# Patient Record
Sex: Female | Born: 1975 | Race: Black or African American | Hispanic: No | Marital: Married | Smoking: Former smoker
Health system: Southern US, Community
[De-identification: ages and names within clinical notes are randomized; demographics above are authoritative.]

## PROBLEM LIST (undated history)

## (undated) DIAGNOSIS — IMO0002 Reserved for concepts with insufficient information to code with codable children: Secondary | ICD-10-CM

## (undated) DIAGNOSIS — O009 Unspecified ectopic pregnancy without intrauterine pregnancy: Secondary | ICD-10-CM

## (undated) HISTORY — DX: Unspecified ectopic pregnancy without intrauterine pregnancy: O00.90

## (undated) HISTORY — PX: HAND SURGERY: SHX662

## (undated) HISTORY — DX: Reserved for concepts with insufficient information to code with codable children: IMO0002

## (undated) HISTORY — PX: OTHER SURGICAL HISTORY: SHX169

---

## 2004-03-17 ENCOUNTER — Other Ambulatory Visit: Admission: RE | Admit: 2004-03-17 | Discharge: 2004-03-17 | Payer: Self-pay | Admitting: Obstetrics and Gynecology

## 2004-10-29 ENCOUNTER — Ambulatory Visit (HOSPITAL_COMMUNITY): Admission: RE | Admit: 2004-10-29 | Discharge: 2004-10-29 | Payer: Self-pay | Admitting: Obstetrics and Gynecology

## 2005-03-29 ENCOUNTER — Other Ambulatory Visit: Admission: RE | Admit: 2005-03-29 | Discharge: 2005-03-29 | Payer: Self-pay | Admitting: Obstetrics and Gynecology

## 2006-04-05 ENCOUNTER — Other Ambulatory Visit: Admission: RE | Admit: 2006-04-05 | Discharge: 2006-04-05 | Payer: Self-pay | Admitting: Obstetrics and Gynecology

## 2007-04-16 ENCOUNTER — Other Ambulatory Visit: Admission: RE | Admit: 2007-04-16 | Discharge: 2007-04-16 | Payer: Self-pay | Admitting: Obstetrics and Gynecology

## 2008-03-11 ENCOUNTER — Ambulatory Visit (HOSPITAL_COMMUNITY): Admission: RE | Admit: 2008-03-11 | Discharge: 2008-03-11 | Payer: Self-pay | Admitting: Ophthalmology

## 2008-03-18 ENCOUNTER — Encounter (INDEPENDENT_AMBULATORY_CARE_PROVIDER_SITE_OTHER): Payer: Self-pay | Admitting: Ophthalmology

## 2008-03-18 ENCOUNTER — Ambulatory Visit (HOSPITAL_COMMUNITY): Admission: RE | Admit: 2008-03-18 | Discharge: 2008-03-18 | Payer: Self-pay | Admitting: Ophthalmology

## 2008-04-22 ENCOUNTER — Other Ambulatory Visit: Admission: RE | Admit: 2008-04-22 | Discharge: 2008-04-22 | Payer: Self-pay | Admitting: Obstetrics and Gynecology

## 2008-10-07 ENCOUNTER — Ambulatory Visit: Payer: Self-pay | Admitting: Obstetrics and Gynecology

## 2008-10-07 ENCOUNTER — Encounter: Payer: Self-pay | Admitting: Obstetrics and Gynecology

## 2010-02-23 ENCOUNTER — Ambulatory Visit: Payer: Self-pay | Admitting: Obstetrics and Gynecology

## 2010-02-23 ENCOUNTER — Other Ambulatory Visit: Admission: RE | Admit: 2010-02-23 | Discharge: 2010-02-23 | Payer: Self-pay | Admitting: Obstetrics and Gynecology

## 2010-08-11 ENCOUNTER — Ambulatory Visit: Payer: Self-pay | Admitting: Gynecology

## 2010-09-01 ENCOUNTER — Ambulatory Visit: Payer: Self-pay | Admitting: Obstetrics and Gynecology

## 2011-03-09 ENCOUNTER — Encounter: Payer: Self-pay | Admitting: Obstetrics and Gynecology

## 2011-03-21 ENCOUNTER — Other Ambulatory Visit: Payer: Self-pay | Admitting: Obstetrics and Gynecology

## 2011-03-21 ENCOUNTER — Encounter (INDEPENDENT_AMBULATORY_CARE_PROVIDER_SITE_OTHER): Payer: BC Managed Care – PPO | Admitting: Obstetrics and Gynecology

## 2011-03-21 ENCOUNTER — Other Ambulatory Visit (HOSPITAL_COMMUNITY)
Admission: RE | Admit: 2011-03-21 | Discharge: 2011-03-21 | Disposition: A | Discharge status: Home / Self Care | Payer: BC Managed Care – PPO | Source: Ambulatory Visit | Attending: Obstetrics and Gynecology | Admitting: Obstetrics and Gynecology

## 2011-03-21 DIAGNOSIS — Z124 Encounter for screening for malignant neoplasm of cervix: Secondary | ICD-10-CM | POA: Insufficient documentation

## 2011-03-21 DIAGNOSIS — R823 Hemoglobinuria: Secondary | ICD-10-CM

## 2011-03-21 DIAGNOSIS — Z01419 Encounter for gynecological examination (general) (routine) without abnormal findings: Secondary | ICD-10-CM

## 2011-03-21 DIAGNOSIS — N938 Other specified abnormal uterine and vaginal bleeding: Secondary | ICD-10-CM

## 2011-03-21 DIAGNOSIS — N949 Unspecified condition associated with female genital organs and menstrual cycle: Secondary | ICD-10-CM

## 2011-09-19 LAB — GRAM STAIN

## 2011-10-19 ENCOUNTER — Ambulatory Visit: Payer: BC Managed Care – PPO | Admitting: Obstetrics and Gynecology

## 2011-10-19 ENCOUNTER — Telehealth: Payer: Self-pay

## 2011-10-19 NOTE — Telephone Encounter (Signed)
Patient called and c/o hematuria x a couple of days.  She has used a tampon and is sure it is not vaginal bleeding.  She also, c/s some frequency and some tingling at the end of urination.  She said when I called her back I will have to leave her a message.  I called her back at 10:55am and recommend office visit. I told her I have scheduled her for 12:30pm appt to check in 12:15pm.  Pt to call back if this will not work out for her.

## 2011-10-20 ENCOUNTER — Encounter: Payer: Self-pay | Admitting: Obstetrics and Gynecology

## 2011-10-20 ENCOUNTER — Ambulatory Visit (INDEPENDENT_AMBULATORY_CARE_PROVIDER_SITE_OTHER): Payer: BC Managed Care – PPO | Admitting: Obstetrics and Gynecology

## 2011-10-20 DIAGNOSIS — R823 Hemoglobinuria: Secondary | ICD-10-CM

## 2011-10-20 DIAGNOSIS — R319 Hematuria, unspecified: Secondary | ICD-10-CM

## 2011-10-20 MED ORDER — NITROFURANTOIN MONOHYD MACRO 100 MG PO CAPS: 100.0000 mg | ORAL_CAPSULE | Freq: Two times a day (BID) | ORAL | Status: AC

## 2011-10-20 NOTE — Progress Notes (Signed)
Patient came to see me today with a 48-hour history of hematuria. She is having no dysuria and no frequency. She's never had this problem previously.  Urinalysis: Too numerous to count red blood cells. Some white blood cells.  Assessment: Hematuria  Plan: Macrobid twice a day with food for 7 days. Urine culture. Office visit with urinalysis in one week. Discussed urological consult pending outcome of the above. Patient also asked again about her infertility. We have never gotten her husband's sperm count which was low. He's been treated with testosterone but stopped testosterone in June and did not go back until recently for sperm count. It was still low. Patient will get me the counts. We will then discussed whether we should proceed with super ovulation, IUI or IVF.

## 2011-10-27 ENCOUNTER — Ambulatory Visit (INDEPENDENT_AMBULATORY_CARE_PROVIDER_SITE_OTHER): Payer: BC Managed Care – PPO | Admitting: Obstetrics and Gynecology

## 2011-10-27 DIAGNOSIS — R319 Hematuria, unspecified: Secondary | ICD-10-CM

## 2011-10-27 DIAGNOSIS — N390 Urinary tract infection, site not specified: Secondary | ICD-10-CM

## 2011-10-27 NOTE — Progress Notes (Signed)
Patient came back to see me today for further followup. Urine culture showed 25,000 pure colonies. Patient took all her antibiotic and her hematuria is gone. She brought her husband's semen analysis today. 5 months after treatment he has 3 million sperm per ejaculate. The motility was decreased. Patient had a previous workup. She had a normal HSG, saline infusion histogram, and ovulatory progesterone. Her periods remain normal.  Assessment #1. Hematuria #2. 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 Patient was reassured. We'll recheck her urine in March at  yearly exam. Told her I thought her best chances of conception were with IVF. She was referred to the [facility] program.

## 2011-11-03 ENCOUNTER — Encounter: Payer: Self-pay | Admitting: Obstetrics and Gynecology

## 2012-03-21 ENCOUNTER — Other Ambulatory Visit (HOSPITAL_COMMUNITY)
Admission: RE | Admit: 2012-03-21 | Discharge: 2012-03-21 | Disposition: A | Discharge status: Home / Self Care | Payer: BC Managed Care – PPO | Source: Ambulatory Visit | Attending: Obstetrics and Gynecology | Admitting: Obstetrics and Gynecology

## 2012-03-21 ENCOUNTER — Ambulatory Visit (INDEPENDENT_AMBULATORY_CARE_PROVIDER_SITE_OTHER): Payer: BC Managed Care – PPO | Admitting: Obstetrics and Gynecology

## 2012-03-21 ENCOUNTER — Encounter: Payer: Self-pay | Admitting: Obstetrics and Gynecology

## 2012-03-21 ENCOUNTER — Other Ambulatory Visit: Payer: Self-pay | Admitting: Obstetrics and Gynecology

## 2012-03-21 VITALS — BP 134/84 | Ht 65.0 in | Wt 198.0 lb

## 2012-03-21 DIAGNOSIS — Z833 Family history of diabetes mellitus: Secondary | ICD-10-CM

## 2012-03-21 DIAGNOSIS — Z01419 Encounter for gynecological examination (general) (routine) without abnormal findings: Secondary | ICD-10-CM

## 2012-03-21 LAB — URINALYSIS W MICROSCOPIC + REFLEX CULTURE
Crystals: NONE SEEN
Glucose, UA: NEGATIVE mg/dL
Ketones, ur: NEGATIVE mg/dL
Nitrite: NEGATIVE
Protein, ur: NEGATIVE mg/dL
Specific Gravity, Urine: 1.025 (ref 1.005–1.030)
Urobilinogen, UA: 0.2 mg/dL (ref 0.0–1.0)

## 2012-03-21 NOTE — Progress Notes (Signed)
Patient came to see me today for her annual GYN exam. She is having regular cycles. She is having no pelvic pain. She is having no abnormal bleeding. She has had frequent endocervical polyps. When she has them she usually has pain with intercourse and some spotting. She has had none of those symptoms. She has an appointment [facility] infertility in early April. 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 She will notice due to moisture she has itching under her breasts.  Physical examination: Kennon Portela present. HEENT within normal limits. Neck: Thyroid not large. No masses. Supraclavicular nodes: not enlarged. Breasts: Examined in both sitting and lying  position. No skin changes and no masses. Abdomen: Soft no guarding rebound or masses or hernia. Pelvic: External: Within normal limits. BUS: Within normal limits. Vaginal:within normal limits. Good estrogen effect. No evidence of cystocele rectocele or enterocele. Cervix: clean. Uterus: Normal size and shape. Adnexa: No masses. Rectovaginal exam: Confirmatory and negative. Extremities: Within normal limits.  Assessment: Normal GYN exam. Low grade fungal infection under breast. Infertility.  Plan: Monistat-Derm. We pulled infertility records and will make copies and she will pick them up prior to her visit at [facility].

## 2012-03-22 LAB — CBC WITH DIFFERENTIAL/PLATELET
Basophils Relative: 1 % (ref 0–1)
Eosinophils Absolute: 0.3 10*3/uL (ref 0.0–0.7)
Eosinophils Relative: 3 % (ref 0–5)
HCT: 35 % — ABNORMAL LOW (ref 36.0–46.0)
Hemoglobin: 11.6 g/dL — ABNORMAL LOW (ref 12.0–15.0)
Lymphocytes Relative: 36 % (ref 12–46)
Lymphs Abs: 3.5 10*3/uL (ref 0.7–4.0)
MCH: 27.6 pg (ref 26.0–34.0)
MCHC: 33.1 g/dL (ref 30.0–36.0)
Monocytes Absolute: 0.5 10*3/uL (ref 0.1–1.0)
Monocytes Relative: 5 % (ref 3–12)
Neutro Abs: 5.5 10*3/uL (ref 1.7–7.7)
Platelets: 326 10*3/uL (ref 150–400)
RBC: 4.21 MIL/uL (ref 3.87–5.11)
RDW: 12.8 % (ref 11.5–15.5)
WBC: 9.8 10*3/uL (ref 4.0–10.5)

## 2012-03-22 LAB — HEMOGLOBIN A1C
Hgb A1c MFr Bld: 5.8 % — ABNORMAL HIGH (ref <5.7)
Mean Plasma Glucose: 120 mg/dL — ABNORMAL HIGH (ref <117)

## 2012-03-23 LAB — URINE CULTURE: Colony Count: 25000

## 2012-07-06 ENCOUNTER — Ambulatory Visit: Payer: BC Managed Care – PPO | Admitting: Gynecology

## 2012-07-13 ENCOUNTER — Ambulatory Visit: Payer: BC Managed Care – PPO | Admitting: Gynecology

## 2012-07-20 ENCOUNTER — Ambulatory Visit (INDEPENDENT_AMBULATORY_CARE_PROVIDER_SITE_OTHER): Payer: BC Managed Care – PPO | Admitting: Gynecology

## 2012-07-20 ENCOUNTER — Encounter: Payer: Self-pay | Admitting: Gynecology

## 2012-07-20 DIAGNOSIS — N926 Irregular menstruation, unspecified: Secondary | ICD-10-CM

## 2012-07-20 DIAGNOSIS — N841 Polyp of cervix uteri: Secondary | ICD-10-CM

## 2012-07-20 NOTE — Patient Instructions (Signed)
Office will call you with biopsy results 

## 2012-07-20 NOTE — Progress Notes (Signed)
Patient presents complaining of midcycle spotting and some discomfort with intercourse this past cycle. She suspects that she may have an endocervical polyp is these are the symptoms she has and has had a history of frequent polyps.  Exam with [name] assistant Pelvic: External BUS vagina normal. Cervix with endocervical polyp extruding from the costs. Uterus normal size midline mobile nontender. Adnexa without masses or tenderness.  Procedure: Endocervical polyp was removed in 2 passes using a cervical biopsy instrument to allow entrance into the canal.  Silver nitrate applied to the base afterwards.  Specimen sent to pathology.  Assessment and plan: Endocervical polyp removed. Patient will follow up her biopsy results. Otherwise assuming benign polyp plan routine follow up next year when she is due for her annual exam.

## 2013-06-21 ENCOUNTER — Ambulatory Visit (INDEPENDENT_AMBULATORY_CARE_PROVIDER_SITE_OTHER): Payer: BC Managed Care – PPO | Admitting: Gynecology

## 2013-06-21 ENCOUNTER — Encounter: Payer: Self-pay | Admitting: Gynecology

## 2013-06-21 DIAGNOSIS — N841 Polyp of cervix uteri: Secondary | ICD-10-CM

## 2013-06-21 NOTE — Progress Notes (Signed)
Patient presents complaining of "endocervical polyp". Has a history of recurrent polyps that always present with spotting after intercourse and pain with intercourse. This is been going on now for 2 weeks.  Exam was Radiographer, therapeutic External BUS vagina normal. Cervix with small endocervical polyp. Uterus normal size midline mobile nontender. Adnexa without masses or tenderness.  Procedure: Under colposcopic guidance an endocervical curettage was performed to remove the endocervical polyp and stalk. This was sent to pathology. Silver nitrate was applied afterwards.  Assessment and plan: Recurrent endocervical polyp removed. Sent to pathology. Followup as needed otherwise for her annual exam.

## 2013-06-21 NOTE — Patient Instructions (Signed)
Follow up if any issues

## 2013-07-12 ENCOUNTER — Encounter: Payer: Self-pay | Admitting: Gynecology

## 2013-07-12 ENCOUNTER — Ambulatory Visit (INDEPENDENT_AMBULATORY_CARE_PROVIDER_SITE_OTHER): Payer: BC Managed Care – PPO | Admitting: Gynecology

## 2013-07-12 VITALS — BP 120/76 | Ht 64.0 in | Wt 205.0 lb

## 2013-07-12 DIAGNOSIS — Z01419 Encounter for gynecological examination (general) (routine) without abnormal findings: Secondary | ICD-10-CM

## 2013-07-12 DIAGNOSIS — Z13220 Encounter for screening for lipoid disorders: Secondary | ICD-10-CM

## 2013-07-12 LAB — CBC WITH DIFFERENTIAL/PLATELET
Basophils Absolute: 0.1 10*3/uL (ref 0.0–0.1)
Basophils Relative: 1 % (ref 0–1)
Eosinophils Absolute: 0.6 10*3/uL (ref 0.0–0.7)
Eosinophils Relative: 6 % — ABNORMAL HIGH (ref 0–5)
HCT: 36.4 % (ref 36.0–46.0)
Hemoglobin: 12.5 g/dL (ref 12.0–15.0)
Lymphocytes Relative: 25 % (ref 12–46)
MCH: 27.4 pg (ref 26.0–34.0)
MCHC: 34.3 g/dL (ref 30.0–36.0)
MCV: 79.6 fL (ref 78.0–100.0)
Monocytes Absolute: 0.4 10*3/uL (ref 0.1–1.0)
Monocytes Relative: 4 % (ref 3–12)
Neutro Abs: 6.2 10*3/uL (ref 1.7–7.7)
Neutrophils Relative %: 64 % (ref 43–77)
RBC: 4.57 MIL/uL (ref 3.87–5.11)
RDW: 13.5 % (ref 11.5–15.5)

## 2013-07-12 NOTE — Progress Notes (Signed)
Megan Turner May 21, 1976 [id number]        37 y.o.  G1P0010 for annual exam.  Former patient of Dr. Eda Paschal. Recent removal of a benign endocervical polyp.  Past medical history,surgical history, medications, allergies, family history and social history were all reviewed and documented in the EPIC chart.  ROS:  Performed and pertinent positives and negatives are included in the history, assessment and plan .  Exam: [name] assistant Filed Vitals:   07/12/13 1158  BP: 120/76  Height: 5\' 4"  (1.626 m)  Weight: 205 lb (92.987 kg)   General appearance  Normal Skin grossly normal Head/Neck normal with no cervical or supraclavicular adenopathy thyroid normal Lungs  clear Cardiac RR, without RMG Abdominal  soft, nontender, without masses, organomegaly or hernia Breasts  examined lying and sitting without masses, retractions, discharge or axillary adenopathy. Pelvic  Ext/BUS/vagina  normal   Cervix  normal   Uterus  anteverted, normal size, shape and contour, midline and mobile nontender   Adnexa  Without masses or tenderness    Anus and perineum  normal   Rectovaginal  normal sphincter tone without palpated masses or tenderness.    Assessment/Plan:  37 y.o. G64P0010 female for annual exam, regular menses. .   1. Infertility. Patient is actively undergoing infertility evaluation and treatment by Dr. April Manson and will continue to followup with him. 2. Recent endocervical polyp removal. Exam is normal today. We'll continue to monitor. 3. Pap smear 02/2012. The Pap smear done today. No history of abnormal Pap smears previously. Discussed current screening guidelines we'll plan repeat a 3 year interval. 4. Screening mammographic recommendations between 35 and 40 were reviewed. Is no strong family history and plans to wait closer to 40 schedule. S/P not be reviewed. 5. Health maintenance. Baseline CBC comprehensive metabolic panel urinalysis lipid profile ordered. Followup one year, sooner  as needed.  Note: This document was prepared with digital dictation and possible smart phrase technology. Any transcriptional errors that result from this process are unintentional.   Dara Lords MD, 12:45 PM 07/12/2013

## 2013-07-12 NOTE — Patient Instructions (Signed)
Follow up in one year for annual exam 

## 2013-07-13 LAB — URINALYSIS W MICROSCOPIC + REFLEX CULTURE
Bacteria, UA: NONE SEEN
Bilirubin Urine: NEGATIVE
Casts: NONE SEEN
Hgb urine dipstick: NEGATIVE
Ketones, ur: NEGATIVE mg/dL
Leukocytes, UA: NEGATIVE
Nitrite: NEGATIVE
Protein, ur: NEGATIVE mg/dL
Specific Gravity, Urine: 1.017 (ref 1.005–1.030)
Urobilinogen, UA: 0.2 mg/dL (ref 0.0–1.0)
pH: 5.5 (ref 5.0–8.0)

## 2013-07-13 LAB — COMPREHENSIVE METABOLIC PANEL
ALT: 11 U/L (ref 0–35)
AST: 12 U/L (ref 0–37)
Albumin: 3.9 g/dL (ref 3.5–5.2)
Alkaline Phosphatase: 68 U/L (ref 39–117)
BUN: 9 mg/dL (ref 6–23)
CO2: 25 mEq/L (ref 19–32)
Chloride: 105 mEq/L (ref 96–112)
Glucose, Bld: 100 mg/dL — ABNORMAL HIGH (ref 70–99)
Potassium: 4.2 mEq/L (ref 3.5–5.3)
Sodium: 137 mEq/L (ref 135–145)
Total Bilirubin: 0.3 mg/dL (ref 0.3–1.2)
Total Protein: 6.7 g/dL (ref 6.0–8.3)

## 2013-07-13 LAB — LIPID PANEL
Cholesterol: 188 mg/dL (ref 0–200)
HDL: 60 mg/dL (ref >39)
LDL Cholesterol: 115 mg/dL — ABNORMAL HIGH (ref 0–99)
Total CHOL/HDL Ratio: 3.1 Ratio
Triglycerides: 67 mg/dL (ref <150)
VLDL: 13 mg/dL (ref 0–40)

## 2013-07-17 ENCOUNTER — Encounter: Payer: Self-pay | Admitting: Obstetrics and Gynecology

## 2014-04-25 DIAGNOSIS — O009 Unspecified ectopic pregnancy without intrauterine pregnancy: Secondary | ICD-10-CM

## 2014-04-25 HISTORY — DX: Unspecified ectopic pregnancy without intrauterine pregnancy: O00.90

## 2014-05-27 ENCOUNTER — Encounter: Payer: Self-pay | Admitting: Gynecology

## 2014-05-27 ENCOUNTER — Ambulatory Visit (INDEPENDENT_AMBULATORY_CARE_PROVIDER_SITE_OTHER): Payer: BC Managed Care – PPO | Admitting: Gynecology

## 2014-05-27 DIAGNOSIS — N899 Noninflammatory disorder of vagina, unspecified: Secondary | ICD-10-CM

## 2014-05-27 DIAGNOSIS — N898 Other specified noninflammatory disorders of vagina: Secondary | ICD-10-CM

## 2014-05-27 DIAGNOSIS — N841 Polyp of cervix uteri: Secondary | ICD-10-CM

## 2014-05-27 LAB — WET PREP FOR TRICH, YEAST, CLUE
Clue Cells Wet Prep HPF POC: NONE SEEN
Trich, Wet Prep: NONE SEEN
Yeast Wet Prep HPF POC: NONE SEEN

## 2014-05-27 MED ORDER — FLUCONAZOLE 150 MG PO TABS: 150.0000 mg | ORAL_TABLET | Freq: Once | ORAL | Status: DC

## 2014-05-27 NOTE — Patient Instructions (Signed)
Take the one Diflucan pill. Call me if your symptoms persist, worsen or recur. Office will call you with the biopsy results.

## 2014-05-27 NOTE — Progress Notes (Signed)
Megan Turner 18-Mar-1976 [id number]        38 y.o.  G1P0010 presents complaining of itching and vulvar irritation. A round of antibiotics previously and this started after that. Is currently undergoing treatment for an ectopic pregnancy following serial beta-hCGs after methotrexate treatment. Last value 20 with planned repeat next week. Currently undergoing infertility treatment.  Past medical history,surgical history, problem list, medications, allergies, family history and social history were all reviewed and documented in the EPIC chart.  Directed ROS with pertinent positives and negatives documented in the history of present illness/assessment and plan.  Exam: [name] assistant General appearance  Normal External BUS vagina with whitish discharge. Cervix with endocervical polyp extruding from the os. Uterus normal size midline mobile nontender. Adnexa without masses or tenderness.  Procedure: Endocervical polyp was biopsied off with the Kevorkian biopsy. Sent to pathology. Patient tolerated well.  Assessment/Plan:  38 y.o. G1P0010  with: 1. Vaginal irritation and discharge. Wet prep is unremarkable but she did treat herself previously with OTC. Will treat with Diflucan 150 mg x1 dose. Followup if symptoms persist, worsen or recur. 2. Endocervical polyp. Biopsied off. Patient will followup for pathology report. Patient will continue to followup with her infertility doctor for treatment of her ectopic and subsequent infertility care.   Note: This document was prepared with digital dictation and possible smart phrase technology. Any transcriptional errors that result from this process are unintentional.   Anastasio Auerbach MD, 4:30 PM 05/27/2014

## 2014-07-24 ENCOUNTER — Encounter: Payer: Self-pay | Admitting: Gynecology

## 2014-07-24 ENCOUNTER — Ambulatory Visit (INDEPENDENT_AMBULATORY_CARE_PROVIDER_SITE_OTHER): Payer: BC Managed Care – PPO | Admitting: Gynecology

## 2014-07-24 ENCOUNTER — Other Ambulatory Visit (HOSPITAL_COMMUNITY)
Admission: RE | Admit: 2014-07-24 | Discharge: 2014-07-24 | Disposition: A | Discharge status: Home / Self Care | Payer: BC Managed Care – PPO | Source: Ambulatory Visit | Attending: Gynecology | Admitting: Gynecology

## 2014-07-24 VITALS — BP 134/84 | Ht 64.0 in | Wt 240.0 lb

## 2014-07-24 DIAGNOSIS — Z1151 Encounter for screening for human papillomavirus (HPV): Secondary | ICD-10-CM | POA: Insufficient documentation

## 2014-07-24 DIAGNOSIS — Z01419 Encounter for gynecological examination (general) (routine) without abnormal findings: Secondary | ICD-10-CM | POA: Insufficient documentation

## 2014-07-24 LAB — COMPREHENSIVE METABOLIC PANEL WITH GFR
ALT: 13 U/L (ref 0–35)
AST: 16 U/L (ref 0–37)
Albumin: 3.9 g/dL (ref 3.5–5.2)
Alkaline Phosphatase: 61 U/L (ref 39–117)
BUN: 10 mg/dL (ref 6–23)
CO2: 24 meq/L (ref 19–32)
Calcium: 9.3 mg/dL (ref 8.4–10.5)
Chloride: 106 meq/L (ref 96–112)
Creat: 0.91 mg/dL (ref 0.50–1.10)
Glucose, Bld: 95 mg/dL (ref 70–99)
Potassium: 3.9 meq/L (ref 3.5–5.3)
Sodium: 138 meq/L (ref 135–145)
Total Bilirubin: 0.2 mg/dL (ref 0.2–1.2)
Total Protein: 6.9 g/dL (ref 6.0–8.3)

## 2014-07-24 LAB — LIPID PANEL
Cholesterol: 178 mg/dL (ref 0–200)
HDL: 46 mg/dL (ref >39)
LDL Cholesterol: 98 mg/dL (ref 0–99)
Total CHOL/HDL Ratio: 3.9 ratio
Triglycerides: 169 mg/dL — ABNORMAL HIGH (ref <150)
VLDL: 34 mg/dL (ref 0–40)

## 2014-07-24 NOTE — Progress Notes (Signed)
Megan Turner 09/15/1976 [id number]        38 y.o.  G1P0010 for annual exam.  Several issues noted below.  Past medical history,surgical history, problem list, medications, allergies, family history and social history were all reviewed and documented as reviewed in the EPIC chart.  ROS:  12 system ROS performed with pertinent positives and negatives included in the history, assessment and plan.   Additional significant findings :  None   Exam: [name] Counsellor Vitals:   07/24/14 1618  BP: 134/84  Height: 5\' 4"  (1.626 m)  Weight: 240 lb (108.863 kg)   General appearance:  Normal affect, orientation and appearance. Skin: Grossly normal HEENT: Without gross lesions.  No cervical or supraclavicular adenopathy. Thyroid normal.  Lungs:  Clear without wheezing, rales or rhonchi Cardiac: RR, without RMG Abdominal:  Soft, nontender, without masses, guarding, rebound, organomegaly or hernia Breasts:  Examined lying and sitting without masses, retractions, discharge or axillary adenopathy. Pelvic:  Ext/BUS/vagina normal  Cervix normal. Pap/HPV  Uterus anteverted, normal size, shape and contour, midline and mobile nontender   Adnexa  Without masses or tenderness    Anus and perineum  Normal   Rectovaginal  Normal sphincter tone without palpated masses or tenderness.    Assessment/Plan:  38 y.o. G13P0010 female for annual exam.   1. Infertility/irregular menses. Patient is undergoing in vitro fertilization through [facility]. Unfortunately recently had ectopic pregnancy in May requiring 2 courses of methotrexate. They are still following her beta hCGs. Patient will continue to followup with them in reference to her fertility treatments. 2. Pap smear 2013. Pap/HPV today. No history of abnormal Pap smears previously. Did have benign reactive changes on her last Pap smear. Assuming this Pap smear is normal then plan 3-5 year interval repeat per current screening guidelines. 3. Mammography. I  reviewed screening mammographic recommendations between 67 and 40. She has no strong family history. Patient's going to wait until closer to 40 and hopefully have achieved pregnancy before then. 4. Health maintenance. Blood pressure 134/84. Reviewed with patient. Recommended to follow in a non-exam situation. If remains elevated will need referral to a primary physician for management. Issues of weight and exercise discussed. Baseline CBC comprehensive metabolic panel lipid profile urinalysis ordered. Followup in one year, sooner as needed.`   Note: This document was prepared with digital dictation and possible smart phrase technology. Any transcriptional errors that result from this process are unintentional.   Anastasio Auerbach MD, 4:39 PM 07/24/2014

## 2014-07-24 NOTE — Patient Instructions (Signed)
Continue to followup with your infertility doctor.  Continue to follow your blood pressure. Report if it remains elevated.  You may obtain a copy of any labs that were done today by logging onto MyChart as outlined in the instructions provided with your AVS (after visit summary). The office will not call with normal lab results but certainly if there are any significant abnormalities then we will contact you.   Health Maintenance, Female A healthy lifestyle and preventative care can promote health and wellness.  Maintain regular health, dental, and eye exams.  Eat a healthy diet. Foods like vegetables, fruits, whole grains, low-fat dairy products, and lean protein foods contain the nutrients you need without too many calories. Decrease your intake of foods high in solid fats, added sugars, and salt. Get information about a proper diet from your caregiver, if necessary.  Regular physical exercise is one of the most important things you can do for your health. Most adults should get at least 150 minutes of moderate-intensity exercise (any activity that increases your heart rate and causes you to sweat) each week. In addition, most adults need muscle-strengthening exercises on 2 or more days a week.   Maintain a healthy weight. The body mass index (BMI) is a screening tool to identify possible weight problems. It provides an estimate of body fat based on height and weight. Your caregiver can help determine your BMI, and can help you achieve or maintain a healthy weight. For adults 20 years and older:  A BMI below 18.5 is considered underweight.  A BMI of 18.5 to 24.9 is normal.  A BMI of 25 to 29.9 is considered overweight.  A BMI of 30 and above is considered obese.  Maintain normal blood lipids and cholesterol by exercising and minimizing your intake of saturated fat. Eat a balanced diet with plenty of fruits and vegetables. Blood tests for lipids and cholesterol should begin at age 60 and  be repeated every 5 years. If your lipid or cholesterol levels are high, you are over 50, or you are a high risk for heart disease, you may need your cholesterol levels checked more frequently.Ongoing high lipid and cholesterol levels should be treated with medicines if diet and exercise are not effective.  If you smoke, find out from your caregiver how to quit. If you do not use tobacco, do not start.  Lung cancer screening is recommended for adults aged 64 80 years who are at high risk for developing lung cancer because of a history of smoking. Yearly low-dose computed tomography (CT) is recommended for people who have at least a 30-pack-year history of smoking and are a current smoker or have quit within the past 15 years. A pack year of smoking is smoking an average of 1 pack of cigarettes a day for 1 year (for example: 1 pack a day for 30 years or 2 packs a day for 15 years). Yearly screening should continue until the smoker has stopped smoking for at least 15 years. Yearly screening should also be stopped for people who develop a health problem that would prevent them from having lung cancer treatment.  If you are pregnant, do not drink alcohol. If you are breastfeeding, be very cautious about drinking alcohol. If you are not pregnant and choose to drink alcohol, do not exceed 1 drink per day. One drink is considered to be 12 ounces (355 mL) of beer, 5 ounces (148 mL) of wine, or 1.5 ounces (44 mL) of liquor.  Avoid use  of street drugs. Do not share needles with anyone. Ask for help if you need support or instructions about stopping the use of drugs.  High blood pressure causes heart disease and increases the risk of stroke. Blood pressure should be checked at least every 1 to 2 years. Ongoing high blood pressure should be treated with medicines, if weight loss and exercise are not effective.  If you are 17 to 36 years old, ask your caregiver if you should take aspirin to prevent  strokes.  Diabetes screening involves taking a blood sample to check your fasting blood sugar level. This should be done once every 3 years, after age 95, if you are within normal weight and without risk factors for diabetes. Testing should be considered at a younger age or be carried out more frequently if you are overweight and have at least 1 risk factor for diabetes.  Breast cancer screening is essential preventative care for women. You should practice "breast self-awareness." This means understanding the normal appearance and feel of your breasts and may include breast self-examination. Any changes detected, no matter how small, should be reported to a caregiver. Women in their 79s and 30s should have a clinical breast exam (CBE) by a caregiver as part of a regular health exam every 1 to 3 years. After age 70, women should have a CBE every year. Starting at age 20, women should consider having a mammogram (breast X-ray) every year. Women who have a family history of breast cancer should talk to their caregiver about genetic screening. Women at a high risk of breast cancer should talk to their caregiver about having an MRI and a mammogram every year.  Breast cancer gene (BRCA)-related cancer risk assessment is recommended for women who have family members with BRCA-related cancers. BRCA-related cancers include breast, ovarian, tubal, and peritoneal cancers. Having family members with these cancers may be associated with an increased risk for harmful changes (mutations) in the breast cancer genes BRCA1 and BRCA2. Results of the assessment will determine the need for genetic counseling and BRCA1 and BRCA2 testing.  The Pap test is a screening test for cervical cancer. Women should have a Pap test starting at age 87. Between ages 53 and 62, Pap tests should be repeated every 2 years. Beginning at age 46, you should have a Pap test every 3 years as long as the past 3 Pap tests have been normal. If you had a  hysterectomy for a problem that was not cancer or a condition that could lead to cancer, then you no longer need Pap tests. If you are between ages 61 and 6, and you have had normal Pap tests going back 10 years, you no longer need Pap tests. If you have had past treatment for cervical cancer or a condition that could lead to cancer, you need Pap tests and screening for cancer for at least 20 years after your treatment. If Pap tests have been discontinued, risk factors (such as a new sexual partner) need to be reassessed to determine if screening should be resumed. Some women have medical problems that increase the chance of getting cervical cancer. In these cases, your caregiver may recommend more frequent screening and Pap tests.  The human papillomavirus (HPV) test is an additional test that may be used for cervical cancer screening. The HPV test looks for the virus that can cause the cell changes on the cervix. The cells collected during the Pap test can be tested for HPV. The HPV test  could be used to screen women aged 17 years and older, and should be used in women of any age who have unclear Pap test results. After the age of 66, women should have HPV testing at the same frequency as a Pap test.  Colorectal cancer can be detected and often prevented. Most routine colorectal cancer screening begins at the age of 74 and continues through age 6. However, your caregiver may recommend screening at an earlier age if you have risk factors for colon cancer. On a yearly basis, your caregiver may provide home test kits to check for hidden blood in the stool. Use of a small camera at the end of a tube, to directly examine the colon (sigmoidoscopy or colonoscopy), can detect the earliest forms of colorectal cancer. Talk to your caregiver about this at age 43, when routine screening begins. Direct examination of the colon should be repeated every 5 to 10 years through age 65, unless early forms of pre-cancerous  polyps or small growths are found.  Hepatitis C blood testing is recommended for all people born from [birth year] through 1963 and any individual with known risks for hepatitis C.  Practice safe sex. Use condoms and avoid high-risk sexual practices to reduce the spread of sexually transmitted infections (STIs). Sexually active women aged 48 and younger should be checked for Chlamydia, which is a common sexually transmitted infection. Older women with new or multiple partners should also be tested for Chlamydia. Testing for other STIs is recommended if you are sexually active and at increased risk.  Osteoporosis is a disease in which the bones lose minerals and strength with aging. This can result in serious bone fractures. The risk of osteoporosis can be identified using a bone density scan. Women ages 85 and over and women at risk for fractures or osteoporosis should discuss screening with their caregivers. Ask your caregiver whether you should be taking a calcium supplement or vitamin D to reduce the rate of osteoporosis.  Menopause can be associated with physical symptoms and risks. Hormone replacement therapy is available to decrease symptoms and risks. You should talk to your caregiver about whether hormone replacement therapy is right for you.  Use sunscreen. Apply sunscreen liberally and repeatedly throughout the day. You should seek shade when your shadow is shorter than you. Protect yourself by wearing long sleeves, pants, a wide-brimmed hat, and sunglasses year round, whenever you are outdoors.  Notify your caregiver of new moles or changes in moles, especially if there is a change in shape or color. Also notify your caregiver if a mole is larger than the size of a pencil eraser.  Stay current with your immunizations. Document Released: 06/27/2011 Document Revised: 04/08/2013 Document Reviewed: 06/27/2011 [facility] Patient Information 2014 [place].

## 2014-07-25 LAB — CBC WITH DIFFERENTIAL/PLATELET
Basophils Absolute: 0.1 10*3/uL (ref 0.0–0.1)
Basophils Relative: 1 % (ref 0–1)
Eosinophils Absolute: 0.2 10*3/uL (ref 0.0–0.7)
Eosinophils Relative: 2 % (ref 0–5)
HCT: 36 % (ref 36.0–46.0)
Hemoglobin: 12 g/dL (ref 12.0–15.0)
Lymphocytes Relative: 32 % (ref 12–46)
Lymphs Abs: 3.4 10*3/uL (ref 0.7–4.0)
MCH: 27 pg (ref 26.0–34.0)
MCHC: 33.3 g/dL (ref 30.0–36.0)
MCV: 80.9 fL (ref 78.0–100.0)
MONOS PCT: 5 % (ref 3–12)
Monocytes Absolute: 0.5 10*3/uL (ref 0.1–1.0)
Neutro Abs: 6.3 10*3/uL (ref 1.7–7.7)
Neutrophils Relative %: 60 % (ref 43–77)
Platelets: 329 10*3/uL (ref 150–400)
RBC: 4.45 MIL/uL (ref 3.87–5.11)
RDW: 13.9 % (ref 11.5–15.5)
WBC: 10.5 10*3/uL (ref 4.0–10.5)

## 2014-07-25 LAB — URINALYSIS W MICROSCOPIC + REFLEX CULTURE
Bacteria, UA: NONE SEEN
Bilirubin Urine: NEGATIVE
CASTS: NONE SEEN
CRYSTALS: NONE SEEN
GLUCOSE, UA: NEGATIVE mg/dL
Ketones, ur: NEGATIVE mg/dL
Leukocytes, UA: NEGATIVE
Nitrite: NEGATIVE
Protein, ur: NEGATIVE mg/dL
Specific Gravity, Urine: 1.016 (ref 1.005–1.030)
Squamous Epithelial / HPF: NONE SEEN
Urobilinogen, UA: 0.2 mg/dL (ref 0.0–1.0)
pH: 6 (ref 5.0–8.0)

## 2014-07-28 LAB — CYTOLOGY - PAP

## 2014-10-27 ENCOUNTER — Encounter: Payer: Self-pay | Admitting: Gynecology

## 2014-12-23 DIAGNOSIS — N838 Other noninflammatory disorders of ovary, fallopian tube and broad ligament: Secondary | ICD-10-CM | POA: Insufficient documentation

## 2014-12-26 NOTE — L&D Delivery Note (Signed)
Vaginal Delivery Note for Baby A At 1645 the pt c/o increase rectal pressure.  VE, Baby "A" is in the vaginal vault.    Self directed, self guided  pushing began at 1658.   After 30 seconds of pushing the body of a non viable what appears to be a female  infant "Megan Turner" delivered spontaneously with maternal effort in the breech position at 1659   With no signs of life the umbilical cord was clamped and cut then the infant was handed to the mom.  The FOB was a the bedside holding the patients hand but visually distraught and unable to look at the infant.  With mere lifting and no traction the cord separated from the placenta.  Placenta is undelivered as of the timing of this note.   Episiotomy: None  The infant was taken to the warmer to me measured, weight and dress, then he was brought back to mom.  Continue labor orders as written  Current EBL 0,    Sponge, laps and needle count correct and verified with the primary care nurse.  Attending MD available at all times.    Mom was left in stable condition, holding baby.   Pt is requesting genetic testing.      Megan Turner, CNM, MSN 04/20/2015. 5:35 PM

## 2015-02-12 DIAGNOSIS — O200 Threatened abortion: Secondary | ICD-10-CM | POA: Insufficient documentation

## 2015-03-05 LAB — OB RESULTS CONSOLE ANTIBODY SCREEN: Antibody Screen: NEGATIVE

## 2015-03-05 LAB — OB RESULTS CONSOLE HIV ANTIBODY (ROUTINE TESTING): HIV: NONREACTIVE

## 2015-03-05 LAB — OB RESULTS CONSOLE ABO/RH: RH TYPE: POSITIVE

## 2015-03-05 LAB — OB RESULTS CONSOLE GBS: GBS: NEGATIVE

## 2015-03-05 LAB — OB RESULTS CONSOLE RPR: RPR: NONREACTIVE

## 2015-03-05 LAB — OB RESULTS CONSOLE HEPATITIS B SURFACE ANTIGEN: Hepatitis B Surface Ag: NEGATIVE

## 2015-03-05 LAB — OB RESULTS CONSOLE GC/CHLAMYDIA
Chlamydia: NEGATIVE
Gonorrhea: NEGATIVE

## 2015-03-05 LAB — OB RESULTS CONSOLE RUBELLA ANTIBODY, IGM: Rubella: IMMUNE

## 2015-03-17 ENCOUNTER — Other Ambulatory Visit (HOSPITAL_COMMUNITY): Payer: Self-pay

## 2015-04-20 ENCOUNTER — Encounter (HOSPITAL_COMMUNITY): Payer: Self-pay

## 2015-04-20 ENCOUNTER — Inpatient Hospital Stay (HOSPITAL_COMMUNITY): Payer: BC Managed Care – PPO

## 2015-04-20 ENCOUNTER — Inpatient Hospital Stay (HOSPITAL_COMMUNITY)
Admission: AD | Admit: 2015-04-20 | Discharge: 2015-05-23 | DRG: 774 | Disposition: A | Discharge status: Home / Self Care | Payer: BC Managed Care – PPO | Source: Ambulatory Visit | Attending: Obstetrics and Gynecology | Admitting: Obstetrics and Gynecology

## 2015-04-20 DIAGNOSIS — O9902 Anemia complicating childbirth: Secondary | ICD-10-CM | POA: Diagnosis not present

## 2015-04-20 DIAGNOSIS — Z3689 Encounter for other specified antenatal screening: Secondary | ICD-10-CM

## 2015-04-20 DIAGNOSIS — Z6841 Body Mass Index (BMI) 40.0 and over, adult: Secondary | ICD-10-CM

## 2015-04-20 DIAGNOSIS — O3442 Maternal care for other abnormalities of cervix, second trimester: Secondary | ICD-10-CM | POA: Diagnosis present

## 2015-04-20 DIAGNOSIS — N760 Acute vaginitis: Secondary | ICD-10-CM | POA: Diagnosis present

## 2015-04-20 DIAGNOSIS — IMO0002 Reserved for concepts with insufficient information to code with codable children: Secondary | ICD-10-CM | POA: Insufficient documentation

## 2015-04-20 DIAGNOSIS — O3412 Maternal care for benign tumor of corpus uteri, second trimester: Secondary | ICD-10-CM | POA: Diagnosis present

## 2015-04-20 DIAGNOSIS — D251 Intramural leiomyoma of uterus: Secondary | ICD-10-CM | POA: Diagnosis present

## 2015-04-20 DIAGNOSIS — O034 Incomplete spontaneous abortion without complication: Secondary | ICD-10-CM | POA: Diagnosis present

## 2015-04-20 DIAGNOSIS — D252 Subserosal leiomyoma of uterus: Secondary | ICD-10-CM | POA: Diagnosis present

## 2015-04-20 DIAGNOSIS — O3432 Maternal care for cervical incompetence, second trimester: Secondary | ICD-10-CM | POA: Diagnosis not present

## 2015-04-20 DIAGNOSIS — O99214 Obesity complicating childbirth: Secondary | ICD-10-CM | POA: Diagnosis present

## 2015-04-20 DIAGNOSIS — O30049 Twin pregnancy, dichorionic/diamniotic, unspecified trimester: Secondary | ICD-10-CM | POA: Diagnosis present

## 2015-04-20 DIAGNOSIS — O09292 Supervision of pregnancy with other poor reproductive or obstetric history, second trimester: Secondary | ICD-10-CM | POA: Diagnosis not present

## 2015-04-20 DIAGNOSIS — O09812 Supervision of pregnancy resulting from assisted reproductive technology, second trimester: Secondary | ICD-10-CM | POA: Diagnosis not present

## 2015-04-20 DIAGNOSIS — Z87891 Personal history of nicotine dependence: Secondary | ICD-10-CM | POA: Diagnosis not present

## 2015-04-20 DIAGNOSIS — O42012 Preterm premature rupture of membranes, onset of labor within 24 hours of rupture, second trimester: Secondary | ICD-10-CM

## 2015-04-20 DIAGNOSIS — F419 Anxiety disorder, unspecified: Secondary | ICD-10-CM | POA: Diagnosis not present

## 2015-04-20 DIAGNOSIS — O418X90 Other specified disorders of amniotic fluid and membranes, unspecified trimester, not applicable or unspecified: Secondary | ICD-10-CM | POA: Diagnosis not present

## 2015-04-20 DIAGNOSIS — K5900 Constipation, unspecified: Secondary | ICD-10-CM | POA: Diagnosis not present

## 2015-04-20 DIAGNOSIS — O09529 Supervision of elderly multigravida, unspecified trimester: Secondary | ICD-10-CM

## 2015-04-20 DIAGNOSIS — O42919 Preterm premature rupture of membranes, unspecified as to length of time between rupture and onset of labor, unspecified trimester: Secondary | ICD-10-CM

## 2015-04-20 DIAGNOSIS — R350 Frequency of micturition: Secondary | ICD-10-CM | POA: Diagnosis not present

## 2015-04-20 DIAGNOSIS — O99612 Diseases of the digestive system complicating pregnancy, second trimester: Secondary | ICD-10-CM | POA: Diagnosis present

## 2015-04-20 DIAGNOSIS — D573 Sickle-cell trait: Secondary | ICD-10-CM | POA: Diagnosis present

## 2015-04-20 DIAGNOSIS — O99342 Other mental disorders complicating pregnancy, second trimester: Secondary | ICD-10-CM | POA: Diagnosis present

## 2015-04-20 DIAGNOSIS — B9620 Unspecified Escherichia coli [E. coli] as the cause of diseases classified elsewhere: Secondary | ICD-10-CM | POA: Diagnosis not present

## 2015-04-20 DIAGNOSIS — O9989 Other specified diseases and conditions complicating pregnancy, childbirth and the puerperium: Secondary | ICD-10-CM | POA: Diagnosis present

## 2015-04-20 DIAGNOSIS — O26872 Cervical shortening, second trimester: Secondary | ICD-10-CM

## 2015-04-20 DIAGNOSIS — O3430 Maternal care for cervical incompetence, unspecified trimester: Secondary | ICD-10-CM | POA: Diagnosis present

## 2015-04-20 DIAGNOSIS — O30042 Twin pregnancy, dichorionic/diamniotic, second trimester: Secondary | ICD-10-CM | POA: Diagnosis present

## 2015-04-20 DIAGNOSIS — O0912 Supervision of pregnancy with history of ectopic or molar pregnancy, second trimester: Secondary | ICD-10-CM

## 2015-04-20 DIAGNOSIS — O42912 Preterm premature rupture of membranes, unspecified as to length of time between rupture and onset of labor, second trimester: Secondary | ICD-10-CM | POA: Diagnosis not present

## 2015-04-20 DIAGNOSIS — N841 Polyp of cervix uteri: Secondary | ICD-10-CM | POA: Diagnosis present

## 2015-04-20 DIAGNOSIS — O321XX1 Maternal care for breech presentation, fetus 1: Secondary | ICD-10-CM | POA: Diagnosis present

## 2015-04-20 DIAGNOSIS — O0902 Supervision of pregnancy with history of infertility, second trimester: Secondary | ICD-10-CM

## 2015-04-20 DIAGNOSIS — O09522 Supervision of elderly multigravida, second trimester: Secondary | ICD-10-CM | POA: Diagnosis not present

## 2015-04-20 DIAGNOSIS — B9689 Other specified bacterial agents as the cause of diseases classified elsewhere: Secondary | ICD-10-CM | POA: Diagnosis not present

## 2015-04-20 DIAGNOSIS — O4290 Premature rupture of membranes, unspecified as to length of time between rupture and onset of labor, unspecified weeks of gestation: Secondary | ICD-10-CM

## 2015-04-20 DIAGNOSIS — Z3A16 16 weeks gestation of pregnancy: Secondary | ICD-10-CM | POA: Diagnosis present

## 2015-04-20 DIAGNOSIS — O468X9 Other antepartum hemorrhage, unspecified trimester: Secondary | ICD-10-CM | POA: Diagnosis not present

## 2015-04-20 DIAGNOSIS — O42119 Preterm premature rupture of membranes, onset of labor more than 24 hours following rupture, unspecified trimester: Secondary | ICD-10-CM

## 2015-04-20 LAB — WET PREP, GENITAL
Trich, Wet Prep: NONE SEEN
Yeast Wet Prep HPF POC: NONE SEEN

## 2015-04-20 LAB — CBC
HCT: 32.3 % — ABNORMAL LOW (ref 36.0–46.0)
Hemoglobin: 11.1 g/dL — ABNORMAL LOW (ref 12.0–15.0)
MCH: 27.7 pg (ref 26.0–34.0)
MCHC: 34.4 g/dL (ref 30.0–36.0)
MCV: 80.5 fL (ref 78.0–100.0)
Platelets: 267 10*3/uL (ref 150–400)
RBC: 4.01 MIL/uL (ref 3.87–5.11)
RDW: 13 % (ref 11.5–15.5)
WBC: 11.9 10*3/uL — AB (ref 4.0–10.5)

## 2015-04-20 LAB — POCT FERN TEST: POCT Fern Test: POSITIVE

## 2015-04-20 LAB — RPR: RPR Ser Ql: NONREACTIVE

## 2015-04-20 MED ORDER — OXYCODONE-ACETAMINOPHEN 5-325 MG PO TABS: 2.0000 | ORAL_TABLET | ORAL | Status: DC | PRN

## 2015-04-20 MED ORDER — AMPICILLIN SODIUM 2 G IJ SOLR
2.0000 g | Freq: Four times a day (QID) | INTRAMUSCULAR | Status: DC
  Filled 2015-04-20: qty 2000

## 2015-04-20 MED ORDER — SODIUM CHLORIDE 0.9 % IV SOLN
2.0000 g | Freq: Four times a day (QID) | INTRAVENOUS | Status: DC
  Administered 2015-04-20 – 2015-04-21 (×5): 2 g via INTRAVENOUS
  Filled 2015-04-20 (×7): qty 2000

## 2015-04-20 MED ORDER — FLEET ENEMA 7-19 GM/118ML RE ENEM: 1.0000 | ENEMA | RECTAL | Status: DC | PRN

## 2015-04-20 MED ORDER — LIDOCAINE HCL (PF) 1 % IJ SOLN
30.0000 mL | INTRAMUSCULAR | Status: DC | PRN
  Filled 2015-04-20: qty 30

## 2015-04-20 MED ORDER — LACTATED RINGERS IV SOLN
INTRAVENOUS | Status: DC
  Administered 2015-04-20: 125 mL/h via INTRAVENOUS
  Administered 2015-04-20: 1000 mL via INTRAVENOUS
  Administered 2015-04-21 (×2): via INTRAVENOUS

## 2015-04-20 MED ORDER — OXYCODONE-ACETAMINOPHEN 5-325 MG PO TABS: 1.0000 | ORAL_TABLET | ORAL | Status: DC | PRN

## 2015-04-20 MED ORDER — OXYTOCIN BOLUS FROM INFUSION: 500.0000 mL | INTRAVENOUS | Status: DC

## 2015-04-20 MED ORDER — ONDANSETRON HCL 4 MG/2ML IJ SOLN: 4.0000 mg | Freq: Four times a day (QID) | INTRAMUSCULAR | Status: DC | PRN

## 2015-04-20 MED ORDER — NALBUPHINE HCL 10 MG/ML IJ SOLN
10.0000 mg | INTRAMUSCULAR | Status: DC | PRN
  Administered 2015-04-20: 10 mg via INTRAVENOUS
  Filled 2015-04-20: qty 1

## 2015-04-20 MED ORDER — ACETAMINOPHEN 325 MG PO TABS
650.0000 mg | ORAL_TABLET | ORAL | Status: DC | PRN
  Administered 2015-04-21: 650 mg via ORAL
  Filled 2015-04-20: qty 2

## 2015-04-20 MED ORDER — AZITHROMYCIN 1 G PO PACK
1.0000 g | PACK | Freq: Once | ORAL | Status: AC
  Administered 2015-04-20: 1 g via ORAL
  Filled 2015-04-20: qty 1

## 2015-04-20 MED ORDER — OXYTOCIN 40 UNITS IN LACTATED RINGERS INFUSION - SIMPLE MED
62.5000 mL/h | INTRAVENOUS | Status: DC
  Filled 2015-04-20: qty 1000

## 2015-04-20 MED ORDER — CITRIC ACID-SODIUM CITRATE 334-500 MG/5ML PO SOLN: 30.0000 mL | ORAL | Status: DC | PRN

## 2015-04-20 MED ORDER — AMOXICILLIN 500 MG PO CAPS
500.0000 mg | ORAL_CAPSULE | Freq: Three times a day (TID) | ORAL | Status: DC
  Filled 2015-04-20: qty 1

## 2015-04-20 MED ORDER — LACTATED RINGERS IV SOLN: 500.0000 mL | INTRAVENOUS | Status: DC | PRN

## 2015-04-20 MED ORDER — ZOLPIDEM TARTRATE 5 MG PO TABS
5.0000 mg | ORAL_TABLET | Freq: Every evening | ORAL | Status: DC | PRN
  Administered 2015-04-20: 5 mg via ORAL
  Filled 2015-04-20: qty 1

## 2015-04-20 MED ORDER — AMPICILLIN SODIUM 2 G IJ SOLR: 2.0000 g | Freq: Four times a day (QID) | INTRAMUSCULAR | Status: DC

## 2015-04-20 NOTE — Progress Notes (Signed)
Entire body of baby in birth canal

## 2015-04-20 NOTE — Progress Notes (Signed)
Pharmacist-Physician Communication  Concern: A possible Drug-Drug Interaction  Description: Coadministration of ondansetron and azithromycin can cause prolongation of QT interval      Recommendation(s):  ECG monitoring is recommended for patients coprescribed azithromycin and ondansetron.       Thank You,

## 2015-04-20 NOTE — Progress Notes (Signed)
39 y.o. year old female,at [redacted]w[redacted]d gestation.  SUBJECTIVE:  The patient reports that she feels a little crampy. She denies bleeding. She feels better after her pain medication.  OBJECTIVE:  BP 133/78 mmHg  Pulse 107  Temp(Src) 98.9 F (37.2 C) (Oral)  Resp 18  Ht 5\' 4"  (1.626 m)  Wt 235 lb 3.2 oz (106.686 kg)  BMI 40.35 kg/m2  SpO2 99%  LMP 07/15/2014  Fetal Heart Tones:  Viable twin B  Contractions:          Mild  Abdomen: Nontender to palpation  ASSESSMENT:  [redacted]w[redacted]d Weeks Pregnancy  Twin gestation  Premature rupture membranes for twin A  Twin A now delivered: Placenta has not delivered  Viable twin B  PLAN:  I reviewed management options with the patient, her husband, and her parents. The patient is stable for the moment. She wants to continue to observe.  Gildardo Cranker, M.D. 04/20/2015 7:16 PM

## 2015-04-20 NOTE — Progress Notes (Signed)
Still waiting for del of placenta

## 2015-04-20 NOTE — H&P (Signed)
Megan Turner is a 39 y.o. female, G4P0030 at 16.1 weeks, presenting w/ c/o possible LOF (clear fluid) at ~ midnight.   Pt gives h/o cramping and back pain since Friday after evening meal. Pain improved some by early Sunday, but then she noted a brown mucosy discharge around 10:30 AM that later resolved. She did not have any further issues until midnight when she thought she urinated on herself. Bright red bleeding was also noted after wiping.  On arrival, reports +FM x 2, no VB, leaking or cramping. Denies intercourse since conception.  Declines Chaplain services at present.  Patient Active Problem List   Diagnosis Date Noted  . Infertility - female 04/20/2015  . Incomplete miscarriage 04/20/2015  . Elderly multigravida 04/20/2015  . Twin pregnancy, twins dichorionic and diamniotic 04/20/2015  . Preterm premature rupture of membranes (PPROM) with unknown onset of labor 04/20/2015  . Severe obesity (BMI >= 40) 04/20/2015  . Cervical polyp 04/20/2015  . Pregnancy in second trimester with history of ectopic pregnancy - pregnancy was achieved through IVF 04/20/2015  . Subchorionic hemorrhage 04/20/2015  . BV (bacterial vaginosis) 04/20/2015  . Fibroids, intramural 04/20/2015  . Fibroids, subserous 04/20/2015  . Determine fetal presentation using ultrasound   . [redacted] weeks gestation of pregnancy   Embryo transfer - IVF pregnancy  History of present pregnancy: Patient entered care at 10.5 weeks, transfer from the [place].  EDC of 10/04/15 was established by 7.3 wk u/s performed by [facility].   [place] evaluations:  7.3 wks: [facility]: 1.14cm x 1.97 cm x 0.31 cm.  10.6 wks: Twins f/u: Di/Di. +FHTs x 2. Anteverted uterus. Twin A inferior left, normal fluid, Twin B superior right, normal fluid, yolk sac seen. Small [facility] still seen - measuring 1.2 x 0.94 x 2.4cm. Cvx closed, adnexa unremarkable, RTOV - unremarkable, LTOV not seen. 13.5 wks [facility] - Twins: Di/Di twin pregnancy. Twin A -  inferior left, Twin B - superior right, subchorionic collection = 1.0cm x 0.55 cm x 1.0 cm, cervix closed. Fibroids - 1) appears right subserosal = 4.7 cm x 3.9 cm x 4.6 cm; 2) posterior intramural = 4.8 cm x 3.1 cm x 3.7 cm; 3) posterior intramural = 3.1 cm x 1.8 cm x 3.2 cm.   Significant prenatal events: Frozen embryo transfer resulting in twin pregnancy via IVF. Bright red bleeding /clots around 10+ wks. Progesterone 50 mg/ml IM oil last taken on 03/10/15. Small [place] since conception. Reported + abdominal tenderness at 11.5 and 13.5 wks. Progesterone level 44.6 on 03/18/15.     Last evaluation: Office on 04/01/15 @ 13.5 wks by Dr. Mancel Bale. +FHTs x2 (154 and 152 respectively). Spec exam performed and revealed ~ 1-2 cm polyp protruding from external os, friable. Cvx closed. Recs per Dr. Mancel Bale = pelvic rest. Bleeding precautions were also reviewed w/ pt.  OB History    Gravida Para Term Preterm AB TAB SAB Ectopic Multiple Living   4    3 2  1   0    1996 @ 9 wks, TAB; no complications per pt report 1998 @ 9 wks TAB; no complications per pt report 05/14/2014 @ 5 wks, ectopic - pregnancy as result of IVF Past Medical History  Diagnosis Date  . Infertility   . Ectopic pregnancy 04/2014    associated with fertility treatments   Past Surgical History  Procedure Laterality Date  . Ear lobe surgery     Family History: family history includes Diabetes in her paternal grandmother;  Heart disease in her father; Hypertension in her father and mother; Kidney disease in her father; Lupus in her brother; Ovarian cancer in her paternal grandmother. Social History:  reports that she has quit smoking. Her smoking use included Cigars. She does not have any smokeless tobacco history on file. She reports that she drinks alcohol. She reports that she does not use illicit drugs.Pt is an [place] American female with a post graduate degree and employed as an Microbiologist. She is married to [place] who is very  supportive. She is of the [place].   Prenatal Transfer Tool  Maternal Diabetes: Unknown Genetic Screening: Normal - low risk [place] on 03/11/15 Maternal Ultrasounds/Referrals: Small [place] since conception Fetal Ultrasounds or other Referrals:  None Maternal Substance Abuse:  No Significant Maternal Medications:  Meds include: Other: Colace, PNVs, Folic acid Significant Maternal Lab Results: None  TDAP NA Flu NA  ROS: LOF, +FM x2, -VB, -cramping  No Known Allergies     Blood pressure 144/92, pulse 90, temperature 99.2 F (37.3 C), temperature source Oral, resp. rate 20, height 5\' 4"  (1.626 m), weight 235 lb 3.2 oz (106.686 kg), last menstrual period 07/15/2014, SpO2 99 %. Results for orders placed or performed during the hospital encounter of 04/20/15 (from the past 24 hour(s))  Wet prep, genital     Status: Abnormal   Collection Time: 04/20/15  1:05 AM  Result Value Ref Range   Yeast Wet Prep HPF POC NONE SEEN NONE SEEN   Trich, Wet Prep NONE SEEN NONE SEEN   Clue Cells Wet Prep HPF POC FEW (A) NONE SEEN   WBC, Wet Prep HPF POC MODERATE (A) NONE SEEN  Fern Test     Status: None   Collection Time: 04/20/15  1:18 AM  Result Value Ref Range   POCT Fern Test Positive = ruptured amniotic membanes   CBC     Status: Abnormal   Collection Time: 04/20/15  1:38 AM  Result Value Ref Range   WBC 11.9 (H) 4.0 - 10.5 K/uL   RBC 4.01 3.87 - 5.11 MIL/uL   Hemoglobin 11.1 (L) 12.0 - 15.0 g/dL   HCT 32.3 (L) 36.0 - 46.0 %   MCV 80.5 78.0 - 100.0 fL   MCH 27.7 26.0 - 34.0 pg   MCHC 34.4 30.0 - 36.0 g/dL   RDW 13.0 11.5 - 15.5 %   Platelets 267 150 - 400 K/uL    Chest clear Heart RRR without murmur Abd gravid, NT Pelvic: SSE: +pooling, +fern, +valsalva. Cvx visually ~ 5 cm with fetal parts/placenta noted. No bleeding. Sample of fluid obtained for wet prep (malodorous) and that was significant for few clue cells.  Ext: 1+ edema bilaterally  FHR: 160 bpm and 150 bpm  respectively   Prenatal labs: ABO, Rh: B positive (03/05/15) Antibody: Neg (03/05/15) Rubella: Immune (03/05/15) RPR: NR (03/05/15) HBsAg: Neg (03/05/15) HIV: Neg (03/05/15) Sickle cell/Hgb electrophoresis: SCT Pap: Normal on 11/09/14 GC: Neg (03/11/15) Chlamydia: Neg (03/11/15) Genetic screenings: Low risk [place] Other: Neg NOB urine culture  Hgb 11.3 at NOB  Assessment/Plan: IUP at 16.1 wks Di/Di Twin pregnancy via IVF-embryo transfer PPROM Afebrile; no concerns for infection at present +[place]es x 2 Severe obesity AMA BV SCT  Plan: Admit to [facility] per consult with Dr. Nelda Marseille as attending MD. Findings reviewed w/ couple and both given time to process dx before proceeding w/ admission. Orders as appropriate, but expectant management for now.  Pain med/epidural prn. Chaplain services at [facility]  request. Supportive care from team prn.   Farrel Gordon CNM, MS 04/20/2015, 02:19 AM

## 2015-04-20 NOTE — MAU Note (Addendum)
Patient presents to MAU with c/o 'possible miscarriage'. States about 30 minutes ago had leaking of clear fluid and vaginal bleeding. Denies pain at this time.

## 2015-04-20 NOTE — Progress Notes (Signed)
39 y.o. year old female,at [redacted]w[redacted]d gestation.  SUBJECTIVE:  The patient says that she is doing okay. She is very sad about the rupture membranes.  OBJECTIVE:  BP 144/92 mmHg  Pulse 103  Temp(Src) 99.2 F (37.3 C) (Oral)  Resp 20  Ht 5\' 4"  (1.626 m)  Wt 235 lb 3.2 oz (106.686 kg)  BMI 40.35 kg/m2  SpO2 82%  LMP 07/15/2014  Fetal Heart Tones:  Stable 2.  Contractions:          None  Abdomen: Nontender  ASSESSMENT:  [redacted]w[redacted]d Weeks Pregnancy  Twin gestation  Breech presentation  Preterm rupture membranes for the presenting twin  Preterm cervical dilation  Anemia  No evidence of chorioamnionitis  PLAN:  A long discussion was held with the patient and her husband. We discussed our management options which include observation at home, observation in the hospital, attempted delivery of the presenting twin, induction of labor for both babies, cesarean delivery, and a second opinion. The risk and benefits of those options were outlined. Questions were answered. The patient elects to get a second opinion from the MFM specialist, and then observe for now. She wants to start antibiotics according to the preterm rupture membranes protocol.  Gildardo Cranker, M.D.

## 2015-04-20 NOTE — Progress Notes (Signed)
39 y.o. year old female,at [redacted]w[redacted]d gestation.  SUBJECTIVE:  The patient complains of increased frequency of urination. She is beginning to feel pressure in the pelvis.  OBJECTIVE:  BP 126/71 mmHg  Pulse 115  Temp(Src) 98.6 F (37 C) (Axillary)  Resp 20  Ht 5\' 4"  (1.626 m)  Wt 235 lb 3.2 oz (106.686 kg)  BMI 40.35 kg/m2  SpO2 99%  LMP 07/15/2014  Fetal Heart Tones:  Stable  Contractions:          None palpable  Abdomen: Nontender Pelvic exam: Feet present in the vagina, cervix is 4 cm dilated At approximately 40% effaced. The fetal body remains within the uterus. No foul-smelling fluid.  ASSESSMENT:  [redacted]w[redacted]d Weeks Pregnancy  Twin gestation  Rupture membranes for twin A  Urinary frequency due to IV fluids  Pelvic pressure that may be contractions.  PLAN:  We will continue to observe only for now.  Decrease IV fluids. The patient is tolerating food and drink by mouth.  Awaiting Maternal Fetal Medicine consult  Gildardo Cranker, M.D. 04/20/2015 2:20 PM

## 2015-04-20 NOTE — Progress Notes (Signed)
Megan Turner [id number]  S: Care Assumed of 39 y.o. G4P0030 at 16.1wks who presented for pPROM of twin A.  Twin A delivered at 1658, but placenta remains in place.  Patient resting in bed upon provider arrival.  Condolences given and patient reports coping well.  POC discussed. O:  Filed Vitals:   04/20/15 1458 04/20/15 1550 04/20/15 1758 04/20/15 1845  BP: 128/88  123/68 133/78  Pulse: 101  106 107  Temp:  98.9 F (37.2 C)    TempSrc:  Oral    Resp:  18    Height:      Weight:      SpO2:       Results for orders placed or performed during the hospital encounter of 04/20/15 (from the past 24 hour(s))  Wet prep, genital     Status: Abnormal   Collection Time: 04/20/15  1:05 AM  Result Value Ref Range   Yeast Wet Prep HPF POC NONE SEEN NONE SEEN   Trich, Wet Prep NONE SEEN NONE SEEN   Clue Cells Wet Prep HPF POC FEW (A) NONE SEEN   WBC, Wet Prep HPF POC MODERATE (A) NONE SEEN  Fern Test     Status: None   Collection Time: 04/20/15  1:18 AM  Result Value Ref Range   POCT Fern Test Positive = ruptured amniotic membanes   CBC     Status: Abnormal   Collection Time: 04/20/15  1:38 AM  Result Value Ref Range   WBC 11.9 (H) 4.0 - 10.5 K/uL   RBC 4.01 3.87 - 5.11 MIL/uL   Hemoglobin 11.1 (L) 12.0 - 15.0 g/dL   HCT 32.3 (L) 36.0 - 46.0 %   MCV 80.5 78.0 - 100.0 fL   MCH 27.7 26.0 - 34.0 pg   MCHC 34.4 30.0 - 36.0 g/dL   RDW 13.0 11.5 - 15.5 %   Platelets 267 150 - 400 K/uL  RPR     Status: None   Collection Time: 04/20/15  1:38 AM  Result Value Ref Range   RPR Ser Ql Non Reactive Non Reactive    A: TIUP at 16.1 wks pPROM IUFD-Delivery of Twin A  P: POC Reviewed: -Expectant management of placenta -MFM consult in the am -Continue mgmt as ordered -Dr. Mahalia Longest to be updated on patient status  [name], MSN, CNM 04/20/2015 10:03 PM

## 2015-04-20 NOTE — Progress Notes (Addendum)
Dr. Nelda Marseille telephonically spoke w/ pt on admission outlining R/B of treatment versus no treatment. Pt at that time elected expectant management and to be re-evaluated in a couple of hours.   In to check on pt and spouse at ~ 03:50. She elects to continue with expectant management and requested to hear heartbeats of twins as hearing them would help her decide what she would like to do next. FHTs present x 2 via doppler and limited bedside u/s. Offered formal u/s to give pt more objective data and she accepted.  She continues to deny VB, LOF and cramping. Reports +FM x 2.  Today's Vitals   04/20/15 0048 04/20/15 0051 04/20/15 0235 04/20/15 0239  BP:  134/90    Pulse:  102    Temp:    98.7 F (37.1 C)  TempSrc:    Oral  Resp:    20  Height:      Weight:      SpO2:      PainSc: 0-No pain  0-No pain 0-No pain    Dr. Nelda Marseille updated. Will await formal report and review results with couple. Will also consider MFM consultation if the need arises.  Farrel Gordon, CNM 04/20/15, 04:50 AM

## 2015-04-20 NOTE — Progress Notes (Signed)
Mrs.Rote was lying in bed and awake and when I arrived. Her husband was bedside and they were comforting each other. She was carrying twins, one is still intact.  I offered my condolences and explained my role. Pt immediately asked me to pray. Offered prayer of comfort, care, and protection. Husband was tearful and looked down most of the visit. She seemed to be more concerned for him than herself. Both were appreciative of visit and prayer. Nurse arrived and needed to care for pt. I explained I would leave for privacy but to let the nurse know if she needs me to return. Ernest Haber Chaplain   04/20/15 1700  Clinical Encounter Type  Visited With Patient and family together

## 2015-04-20 NOTE — Progress Notes (Signed)
Prince Solian, CNM at bedside.  +fetal movement and fetal heart tones x2 via doppler.

## 2015-04-21 ENCOUNTER — Ambulatory Visit (HOSPITAL_COMMUNITY): Payer: BC Managed Care – PPO

## 2015-04-21 ENCOUNTER — Encounter (HOSPITAL_COMMUNITY): Payer: Self-pay | Admitting: *Deleted

## 2015-04-21 MED ORDER — LACTATED RINGERS IV BOLUS (SEPSIS)
500.0000 mL | Freq: Once | INTRAVENOUS | Status: AC
  Administered 2015-04-21: 500 mL via INTRAVENOUS

## 2015-04-21 MED ORDER — LACTATED RINGERS IV SOLN
INTRAVENOUS | Status: DC
  Administered 2015-04-21 – 2015-04-23 (×5): via INTRAVENOUS

## 2015-04-21 MED ORDER — SODIUM CHLORIDE 0.9 % IV SOLN
2.0000 g | Freq: Four times a day (QID) | INTRAVENOUS | Status: AC
  Administered 2015-04-21 – 2015-04-22 (×4): 2 g via INTRAVENOUS
  Filled 2015-04-21 (×4): qty 2000

## 2015-04-21 MED ORDER — ACETAMINOPHEN 325 MG PO TABS
650.0000 mg | ORAL_TABLET | ORAL | Status: DC | PRN
  Administered 2015-04-21 – 2015-05-21 (×8): 650 mg via ORAL
  Filled 2015-04-21 (×8): qty 2

## 2015-04-21 MED ORDER — ZOLPIDEM TARTRATE 5 MG PO TABS
5.0000 mg | ORAL_TABLET | Freq: Every evening | ORAL | Status: DC | PRN
  Administered 2015-04-26: 5 mg via ORAL
  Filled 2015-04-21 (×2): qty 1

## 2015-04-21 MED ORDER — DOCUSATE SODIUM 100 MG PO CAPS
100.0000 mg | ORAL_CAPSULE | Freq: Every day | ORAL | Status: DC
  Administered 2015-04-24 – 2015-05-03 (×10): 100 mg via ORAL
  Filled 2015-04-21 (×12): qty 1

## 2015-04-21 MED ORDER — PRENATAL MULTIVITAMIN CH
1.0000 | ORAL_TABLET | Freq: Every day | ORAL | Status: DC
  Administered 2015-04-23 – 2015-05-23 (×31): 1 via ORAL
  Filled 2015-04-21 (×31): qty 1

## 2015-04-21 MED ORDER — CALCIUM CARBONATE ANTACID 500 MG PO CHEW: 2.0000 | CHEWABLE_TABLET | ORAL | Status: DC | PRN

## 2015-04-21 MED ORDER — AMOXICILLIN 500 MG PO CAPS
500.0000 mg | ORAL_CAPSULE | Freq: Three times a day (TID) | ORAL | Status: AC
  Administered 2015-04-22 – 2015-04-27 (×14): 500 mg via ORAL
  Filled 2015-04-21 (×16): qty 1

## 2015-04-21 NOTE — Consult Note (Signed)
Maternal Fetal Medicine Consultation  Requesting Provider(s): Ena Dawley, MD  Reason for consultation: DC/DA twin gestation, PROM of Twin A s/p delivery of presenting twin  HPI: Megan Turner is a 39 yo G4P0030, EDD 10/04/2015 who is currently at 16w 2d seen for consultation due to PROM and delivery of Twin A.  Ms. 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  Her early pregnancy course was complicated by a subchorionic hemorrhage and chronic bleeding.  She reports that she had not experienced vaginal bleeding and cramping for about 3 weeks prior to this admission.  She was also noted to have a friable cervix and a cervical polyp that was felt to be the cause of some of the vaginal bleeding that she experienced.  Ms. Pincock reports that she experienced leakage of fluid the night prior to admission - was seen in MAU and noted to be fern, Nitrazine and amnisure positive.  Over the course of the day, she had worsening cramping and ultimately delivered twin A at about 5:30 PM yesterday.  She has had very little bleeding or cramping since that time and is currently being managed expectantly.  She is well aware of the poor prognosis for Twin B and the likelihood that she could either develop an intrauterine infection or go onto deliver Twin B.  OB History: OB History    Gravida Para Term Preterm AB TAB SAB Ectopic Multiple Living   4    3 2  1   0      PMH:  Past Medical History  Diagnosis Date  . Infertility   . Ectopic pregnancy 04/2014    associated with fertility treatments    PSH:  Past Surgical History  Procedure Laterality Date  . Ear lobe surgery     Meds:  Scheduled Meds: . [START ON 04/22/2015] amoxicillin  500 mg Oral 3 times per day  . ampicillin (OMNIPEN) IV  2 g Intravenous 4 times per day   Continuous Infusions: . lactated ringers 125 mL/hr at 04/21/15 0246  . oxytocin 40 units in LR 1000 mL    . oxytocin 40 units in LR 1000 mL      PRN Meds:.acetaminophen, citric acid-sodium citrate, lactated ringers, lidocaine (PF), nalbuphine, ondansetron, oxyCODONE-acetaminophen, oxyCODONE-acetaminophen, sodium phosphate, zolpidem  Allergies: No Known Allergies   FH:  Family History  Problem Relation Age of Onset  . Hypertension Mother   . Heart disease Father   . Kidney disease Father   . Hypertension Father   . Diabetes Paternal Grandmother   . Ovarian cancer Paternal Grandmother   . Lupus Brother    Soc:  History   Social History  . Marital Status: Married    Spouse Name: N/A  . Number of Children: N/A  . Years of Education: N/A   Occupational History  . Not on file.   Social History Main Topics  . Smoking status: Former Smoker    Types: Cigars  . Smokeless tobacco: Not on file  . Alcohol Use: 0.0 oz/week     Comment: rare  . Drug Use: No  . Sexual Activity: Yes    Birth Control/ Protection: None   Other Topics Concern  . Not on file   Social History Narrative   Review of Systems: no vaginal bleeding or cramping/contractions, no LOF, no nausea/vomiting. All other systems reviewed and are negative.  PE:   Filed Vitals:   04/21/15 0604  BP: 118/74  Pulse: 87  Temp: 98.3 F (  36.8 C)  Resp: 18    GEN: well-appearing female ABD: gravid, NT  Labs: CBC    Component Value Date/Time   WBC 11.9* 04/20/2015 0138   RBC 4.01 04/20/2015 0138   HGB 11.1* 04/20/2015 0138   HCT 32.3* 04/20/2015 0138   PLT 267 04/20/2015 0138   MCV 80.5 04/20/2015 0138   MCH 27.7 04/20/2015 0138   MCHC 34.4 04/20/2015 0138   RDW 13.0 04/20/2015 0138   LYMPHSABS 3.4 07/24/2014 1641   MONOABS 0.5 07/24/2014 1641   EOSABS 0.2 07/24/2014 1641   BASOSABS 0.1 07/24/2014 1641    A/P:  1) DC/DA twins at 16w 2d - s/p PROM and delivery of Twin B - had long discussion with the patient. We briefly reviewed some different management options.  We briefly discussed delayed interval delivery, and that while there are reports  of some successful outcomes by placing a cerclage, this is a very aggressive treatment option that is fraught with risks and complications.  I personally do not feel comfortable offering this as a treatment option and this does not appear to be a treatment that Ms. Gutmann would like to pursue.  We also discussed expectant management - and that while there are currently no signs of infection or imminent delivery the likelihood that she will remain pregnant until viability is very unlikely.  We also discussed the option of medical induction with misoprostol, and while she has not decided whether or not this is the course that she would like to pursue, I feel that she is leaning in this direction.  We briefly discussed medical termination.  We reviewed the [facility] termination consent.  In my opinion, I feel that the risk of infection/ sepsis and hemorrhage is significant enough to meet the requirements for exemption from the 72 hour rule if the patient and her husband choose to proceed with medical induction of labor for the remaining twin prior to that time.  Recommendations were discussed with Dr.Roberts.   Thank you for the opportunity to be a part of the care of Megan Turner. Please contact our office if we can be of further assistance.   I spent approximately 30 minutes with this patient with over 50% of time spent in face-to-face counseling.  Benjaman Lobe, MD Maternal Fetal Medicine

## 2015-04-21 NOTE — Progress Notes (Signed)
Addendum  Pt has decided to leave baby B in utero  Pt to transfer to [facility] or [facility]

## 2015-04-21 NOTE — Progress Notes (Addendum)
Labor Progress  Subjective: Comfortable, in good spirits, asking appropriate question on the next step  Objective: BP 117/64 mmHg  Pulse 98  Temp(Src) 99.2 F (37.3 C) (Oral)  Resp 18  Ht 5\' 4"  (1.626 m)  Wt 235 lb 3.2 oz (106.686 kg)  BMI 40.35 kg/m2  SpO2 100%  LMP 07/15/2014 I/O last 3 completed shifts: In: -  Out: 4 [Blood:4]   FHT: 160 CTX:  none Uterus gravid, soft non tender SVE:  Dilation: 10 Effacement (%): 40 Station: -3 Exam by:: V Nyiah Pianka cnm  Assessment:  IUP at 16.1 weeks Membranes:  SROM Baby A, intact on Baby B Labor progress: Delivered baby A, no labor for baby B GBS: negative  Plan: MFM consult Dopplers on baby B TID or per pt request [facility] consult with Dr Herbie Baltimore on [place], CNM, MSN 04/21/2015. 4:27 PM

## 2015-04-21 NOTE — Progress Notes (Signed)
I spent time with Megan Turner and Megan Turner who are coping with the mixed emotions of having delivered one twin at 16 weeks and holding out hope for the other twin.  At this time, [name] reported that she is coping okay and does not wish for additional support.  She is aware of ongoing availability of spiritual care.  I encouraged her to communicate any needs or ways that we could help her as she juggles her different emotions.  [place] Pager, [phone] 9:56 AM    04/21/15 0900  Clinical Encounter Type  Visited With Patient;Patient and family together  Visit Type Follow-up  Referral From Nurse;Chaplain  Spiritual Encounters  Spiritual Needs Grief support;Emotional  Stress Factors  Patient Stress Factors Loss

## 2015-04-21 NOTE — Progress Notes (Addendum)
Labor Progress  Subjective: Pt comfortable, no experiencing any pain, bleeding, cramping or discomfort.  Q&A about what's next  Objective: BP 118/74 mmHg  Pulse 87  Temp(Src) 98.3 F (36.8 C) (Oral)  Resp 18  Ht 5\' 4"  (1.626 m)  Wt 235 lb 3.2 oz (106.686 kg)  BMI 40.35 kg/m2  SpO2 99%  LMP 07/15/2014 I/O last 3 completed shifts: In: -  Out: 4 [Blood:4]   FHT: 150 CTX:  none Uterus gravid, soft non tender SVE:  Dilation: 10 Effacement (%): 40 Station: -3 Exam by:: V Lucciana Head cnm  Assessment:  IUDF of baby A at 16.1, placenta undelivered IUP at 16.1 weeks with baby B FHT 158 Membranes:  SROM Baby A, Intact Baby B Labor progress: stalled GBS: negative   Plan: Continue POC plan Fetal dopplers q8 or upon pt request Rest Stool softer PRN Bedpan       Soham Hollett, CNM, MSN 04/21/2015. 11:29 AM

## 2015-04-21 NOTE — Progress Notes (Signed)
Megan Turner [id number]  S: Nurse reports patient passed 4 gram clot.  In room to assess.  No active bleeding.  Patient reports cramping and urge to urinate prior to passing of clot, but none currently.  Husband remains at bedside, supportive  O:  Filed Vitals:   04/20/15 1758 04/20/15 1845 04/20/15 2211 04/21/15 0144  BP: 123/68 133/78 118/67   Pulse: 106 107 99   Temp:   98.7 F (37.1 C) 98.7 F (37.1 C)  TempSrc:   Oral Oral  Resp:   18   Height:      Weight:      SpO2:        A: TIUP at 16.2wks Delivery of Twin A Afebrile Retained Placenta   P: Informed that vaginal exam would be deferred in respects to expectant management Patient informed of need to report increased bleeding, cramping, or passing of clots No questions or concerns Continue other mgmt as ordered  Shaylie Eklund LYNN, MSN, CNM 04/21/2015, 2:01 AM

## 2015-04-22 LAB — CBC WITH DIFFERENTIAL/PLATELET
BASOS PCT: 0 % (ref 0–1)
Basophils Absolute: 0 10*3/uL (ref 0.0–0.1)
EOS ABS: 0.2 10*3/uL (ref 0.0–0.7)
Eosinophils Relative: 2 % (ref 0–5)
HEMATOCRIT: 30.5 % — AB (ref 36.0–46.0)
Hemoglobin: 10.3 g/dL — ABNORMAL LOW (ref 12.0–15.0)
Lymphocytes Relative: 18 % (ref 12–46)
Lymphs Abs: 1.8 10*3/uL (ref 0.7–4.0)
MCH: 27.2 pg (ref 26.0–34.0)
MCHC: 33.8 g/dL (ref 30.0–36.0)
MCV: 80.5 fL (ref 78.0–100.0)
MONOS PCT: 3 % (ref 3–12)
Monocytes Absolute: 0.3 10*3/uL (ref 0.1–1.0)
NEUTROS ABS: 7.9 10*3/uL — AB (ref 1.7–7.7)
Neutrophils Relative %: 77 % (ref 43–77)
Platelets: 217 10*3/uL (ref 150–400)
RBC: 3.79 MIL/uL — ABNORMAL LOW (ref 3.87–5.11)
RDW: 12.8 % (ref 11.5–15.5)
WBC: 10.2 10*3/uL (ref 4.0–10.5)

## 2015-04-22 NOTE — Progress Notes (Addendum)
Megan Turner  [id number]  S/P PROM Twin A, with delivery of non-viable fetus on 04/20/15. Twin A placenta still in situ--cord avulsed at delivery, no extrusion of membrane or cord into vagina Twin B still in-utero and viable (di/di twins, IVF)  Subjective: Resting quietly, husband at bedside.  Patient reports mild abdominal tenderness, but no definitive pain.  Passed single clot last night with BM, no further bleeding or leaking.  Just had another large BM, no bleeding with that event.  Patient wants to do everything possible to maintain remaining pregnancy, but understands situation is high-risk for loss of 2nd twin.  Objective: Vital signs in last 24 hours: Temp:  [98.3 F (36.8 C)-99.2 F (37.3 C)] 98.5 F (36.9 C) (04/27 0506) Pulse Rate:  [84-98] 84 (04/27 0506) Resp:  [18] 18 (04/27 0506) BP: (116-126)/(64-82) 121/73 mmHg (04/27 0506) SpO2:  [98 %-100 %] 100 % (04/27 0506)   Filed Vitals:   04/21/15 1710 04/21/15 2124 04/22/15 0123 04/22/15 0506  BP:  116/78  121/73  Pulse:  88  84  Temp: 98.7 F (37.1 C) 98.5 F (36.9 C) 98.3 F (36.8 C) 98.5 F (36.9 C)  TempSrc: Oral Oral Oral Oral  Resp:  18  18  Height:      Weight:      SpO2:  98%  100%   CBC    Component Value Date/Time   WBC 11.9* 04/20/2015 0138   RBC 4.01 04/20/2015 0138   HGB 11.1* 04/20/2015 0138   HCT 32.3* 04/20/2015 0138   PLT 267 04/20/2015 0138   MCV 80.5 04/20/2015 0138   MCH 27.7 04/20/2015 0138   MCHC 34.4 04/20/2015 0138   RDW 13.0 04/20/2015 0138   LYMPHSABS 3.4 07/24/2014 1641   MONOABS 0.5 07/24/2014 1641   EOSABS 0.2 07/24/2014 1641   BASOSABS 0.1 07/24/2014 1641     EXAM: General: alert Resp: clear to auscultation bilaterally Cardio: regular rate and rhythm, S1, S2 normal, no murmur, click, rub or gallop GI: Abdomen soft, very mild tenderness to palpation, LLQ > RLQ Extremities: extremities normal, atraumatic, no cyanosis or edema Vaginal Bleeding: none at  present  FHR 152 on last assessment (last evening)  Assessment: S/p PROM and delivery of Twin A at 16 1/7 weeks Twin B viable Retained placenta Twin A IVF pregnancy AMA   Plan: Continue current care CBC with diff today MDs will follow--Dr. Charlesetta Garibaldi will see today. Support to patient for loss of Twin A and concern for status of Twin B FHR q 8 hours.   LOS: 2 days    Donnel Saxon, CNM  04/22/2015 9:25 AM   All options with R&B reviewed with the pt.  Pt desires expectant management.  Dr  Leonides Sake does not feel she is a candidate for home management.  Pt understands that induction of labor will be recommneded if any signs of infection of fetal demise occurs.  She agrees with the plan

## 2015-04-22 NOTE — Progress Notes (Signed)
Ur chart review completed.  

## 2015-04-22 NOTE — Progress Notes (Signed)
[name] and Simona Huh are coping as well as can be expected.  They are taking it moment by moment, telling the people that they need for support, and remaining hopeful.  They are so appreciative of all of the memory items that the [employer] provides (through volunteers) and spoke about the desire to be able to provide something for future families who may be going through something similar.    They have my card for continued follow up, but please also page as needs arise.  [place] Pager, [phone] 11:38 AM    04/22/15 1100  Clinical Encounter Type  Visited With Patient and family together  Visit Type Spiritual support;Follow-up

## 2015-04-22 NOTE — Plan of Care (Signed)
Problem: Phase I Progression Outcomes Goal: OOB as tolerated unless otherwise ordered Outcome: Not Applicable Date Met:  01/65/80 bedrest

## 2015-04-23 NOTE — Progress Notes (Addendum)
   S/P PROM Twin A, with delivery of non-viable fetus on 04/20/15.  HD # 4.  Twin A placenta still in situ--cord avulsed at delivery, no extrusion of membrane or cord into vagina Twin B still in-utero and viable (di/di twins, IVF)  Subjective: Awoken from sleep, husband at bedside. Patient reports mild abdominal tenderness, but no definitive pain. Denies fevers or chills. No nausea or vomiting. Denies any vaginal bleeding.   Objective: Vital signs in last 24 hours Filed Vitals:   04/23/15 0100 04/23/15 0533 04/23/15 1000 04/23/15 1200  BP:  130/75  131/80  Pulse:  86  97  Temp: 98.9 F (37.2 C) 98.8 F (37.1 C) 98.4 F (36.9 C) 98.4 F (36.9 C)  TempSrc: Oral Oral Oral Oral  Resp:  18  18  Height:      Weight:      SpO2:  100%  100%         EXAM: General: alert GI: Abdomen soft, nontender to palpation Extremities: extremities normal, atraumatic, no cyanosis or edema Vaginal Bleeding: none at present  FHR 158 on last assessment (11am, 4/28)  Assessment: S/p PROM and delivery of Twin A at 16 1/7 weeks Twin B viable, 16 W 4/7 EGA now Retained placenta Twin A IVF pregnancy AMA   Plan: Continue current care FHR q 8 hours. Oral latency antibiotics

## 2015-04-23 NOTE — Progress Notes (Signed)
Visited twice today, morning and afternoon. Simona Huh was at bedside both times. Adreona talked about her loss and her hopes for the other twin. She also talked about the months of bedrest ahead. She wanted some coloring materials; I brought these to her. She said she would also like to learn to knit or crochet. I let the volunteer office know that she was looking for a volunteer who could teach her.  Presence; listening; support.  Epifania Gore, PhD, [place] Pager: [phone]

## 2015-04-23 NOTE — Progress Notes (Addendum)
Labor Progress  Subjective: Pt comfortable, no experiencing any pain, bleeding, cramping or discomfort.  Q&A about what's next  Objective: Filed Vitals:   04/23/15 0100 04/23/15 0533 04/23/15 1000 04/23/15 1200  BP:  130/75  131/80  Pulse:  86  97  Temp: 98.9 F (37.2 C) 98.8 F (37.1 C) 98.4 F (36.9 C) 98.4 F (36.9 C)  TempSrc: Oral Oral Oral Oral  Resp:  18  18  Height:      Weight:      SpO2:  100%  100%   FHT: 158 CTX:  none Uterus gravid, soft non tender SVE:  deferred  Assessment:  IUDF of baby A at 16.1, placenta undelivered IUP at 16.4 weeks with baby B FHT 158 Membranes:  SROM Baby A, Intact Baby B Labor progress: stalled GBS: negative IV infiltrated   Plan: Continue POC plan Fetal dopplers q8 or upon pt request Rest Stool softer PRN Bedpan May take a 10 minute shower Will reassess cervical dilation in a few weeks via [place] OK to leave IV our until this evening

## 2015-04-24 ENCOUNTER — Inpatient Hospital Stay (HOSPITAL_COMMUNITY): Payer: BC Managed Care – PPO

## 2015-04-24 LAB — TYPE AND SCREEN
ABO/RH(D): B POS
Antibody Screen: NEGATIVE

## 2015-04-24 LAB — ABO/RH: ABO/RH(D): B POS

## 2015-04-24 LAB — AMNISURE RUPTURE OF MEMBRANE (ROM) NOT AT ARMC: AMNISURE: POSITIVE

## 2015-04-24 MED ORDER — NALBUPHINE HCL 10 MG/ML IJ SOLN: 10.0000 mg | INTRAMUSCULAR | Status: DC

## 2015-04-24 MED ORDER — NALBUPHINE HCL 10 MG/ML IJ SOLN
10.0000 mg | INTRAMUSCULAR | Status: DC | PRN
  Filled 2015-04-24: qty 1

## 2015-04-24 NOTE — Progress Notes (Signed)
  Subjective: On L&D, transferred around 5am due to ? SROM Twin B.  Had some cramping earlier, none now.  Denies active leaking or bleeding. Husband at bedside.  Objective: BP 119/72 mmHg  Pulse 78  Temp(Src) 98.9 F (37.2 C) (Oral)  Resp 20  Ht 5\' 4"  (1.626 m)  Wt 106.595 kg (235 lb)  BMI 40.32 kg/m2  SpO2 100%  LMP 07/15/2014 I/O last 3 completed shifts: In: 2482 [I.V.:1657] Out: 2 [Urine:5200; Stool:1] Total I/O In: -  Out: 300 [Urine:300]  Chest clear Heart RRR without murmur Abd soft, NT Pelvic--deferred Ext WNL  FHT: 159 at 0040 No obvious leaking, no bleeding.  Amnisure positive.  CBC Latest Ref Rng 04/22/2015 04/20/2015 07/24/2014  WBC 4.0 - 10.5 K/uL 10.2 11.9(H) 10.5  Hemoglobin 12.0 - 15.0 g/dL 10.3(L) 11.1(L) 12.0  Hematocrit 36.0 - 46.0 % 30.5(L) 32.3(L) 36.0  Platelets 150 - 400 K/uL 217 267 329     [place] preliminary report:  AFV subjectively WNL, AFV 3.17, < 3%ile. Cervix appear funneled, 2.13 cm, placenta anterior.  Unable to visualize Twin A placenta.  Multiple fibroids noted--posterior x 2, 4.8 x 4.8 x 4.3 and 3.5 x 3.8 x 3.5  . amoxicillin  500 mg Oral 3 times per day  . docusate sodium  100 mg Oral Daily  . prenatal multivitamin  1 tablet Oral Q1200  On day 2 of 5 of po ATB course  Assessment:  S/P PROM Twin A, with delivery of non-viable fetus on 04/20/15. Twin A placenta still in situ--cord avulsed at delivery, no extrusion of membrane or cord into vagina Twin B still in-utero and viable (di/di twins, IVF) ? Leaking from Twin B AMA IVF pregnancy GBS negative  Plan: Reviewed status with patient and husband.  Advised her we will continue to observe at present, being vigilant for s/s of infection, labor progression. Support to patient for uncertainty regarding pregnancy outcome.  Donnel Saxon CNM 04/24/2015, 9:05 AM

## 2015-04-24 NOTE — Progress Notes (Signed)
Patient reports gush of fluid from vagina. Towel beneath patient wet. Unable to visualize any leaking fluid. Fetal heart tones 153-159 bpm. Rapid response RN notified and was recommended to call CNM on call. Prince Solian CNM notified, orders received.

## 2015-04-24 NOTE — Progress Notes (Signed)
Hospital day # 4 pregnancy at [redacted]w[redacted]d--S/p PPROM Twin A, with delivery on 04/20/15, retained placenta of Twin A, current viability of Twin B  Transferred to L&D early am for ? ROM Twin B, but no current evidence of active leaking or labor.  Amnisure + during early am, but could be remnant of Twin A fluid.  US showed subjectively normal fluid, cervix 2.17, with mild funneling.  S:  Dr. Charlesetta Garibaldi and this CNM in to discuss plan of care.  Patient resting comfortably, husband at bedside.  No further episodes of leaking, no bleeding.  O: BP 117/75 mmHg  Pulse 87  Temp(Src) 98.8 F (37.1 C) (Oral)  Resp 20  Ht 5\' 4"  (1.626 m)  Wt 106.595 kg (235 lb)  BMI 40.32 kg/m2  SpO2 100%  LMP 07/15/2014      Fetal tracings:  FHR 155 per doppler at 1108      Contractions:  None per patient       Uterus non-tender      Extremities: no significant edema and no signs of DVT          Labs:   Results for orders placed or performed during the hospital encounter of 04/20/15 (from the past 24 hour(s))  Amnisure rupture of membrane (rom)     Status: None   Collection Time: 04/24/15  1:05 AM  Result Value Ref Range   Amnisure ROM POSITIVE   Type and screen     Status: None   Collection Time: 04/24/15  9:31 AM  Result Value Ref Range   ABO/RH(D) B POS    Antibody Screen NEG    Sample Expiration 04/27/2015          Meds:  . amoxicillin  500 mg Oral 3 times per day  . docusate sodium  100 mg Oral Daily  . prenatal multivitamin  1 tablet Oral Q1200  On day 2 of 5 of po ATB  A: [redacted]w[redacted]d with PPROM and delivery of Twin A, retained placenta Twin A, current viability of Twin B     Stable  P: Continue current plan of care      Upcoming tests/treatments:  [place] tomorrow for recheck of fluid, CBC with diff.      MDs will follow      Support to patient for concerns regarding pregnancy.  Donnel Saxon CNM, [place] 04/24/2015 12:37 PM

## 2015-04-24 NOTE — Progress Notes (Signed)
Called Megan Turner CNM to discuss possible transfer of patient to birthing suites as patient continues to have intermittent low back pain and lower abd cramping. She agrees to transfer.

## 2015-04-24 NOTE — Progress Notes (Addendum)
S: TC from pt's primary care nurse advising of pt's report of gush of fluid, back pain and pressure. Towel per pt noted to be saturated. Upon arrival, pt and spouse comforting one another stating they hope that the placenta from Twin A is separating and that her BOW remains intact for Twin B. Reports a tad bit of cramping, uncontrollable shaking of legs, but no VB.   O:  Today's Vitals   04/23/15 1744 04/23/15 2010 04/23/15 2122 04/24/15 0040  BP: 139/78  120/73   Pulse: 97  87   Temp: 98.8 F (37.1 C)  98.9 F (37.2 C)   TempSrc: Oral  Oral   Resp: 18  18   Height:      Weight:      SpO2: 100%  100%   PainSc:  0-No pain  0-No pain  Gen: anxious Abdomen: soft, non-tender FHR 159 bpm at 00:40 AM Pelvic: No obvious leaking of fluid. Amnisure collected  A: previable preterm PROM resulting in delivery of Twin A on 04/20/15 @ 16:59 PM R/O PROM of Twin B No s/s of infection  P: Await results of amnisure   Farrel Gordon, CNM 04/24/15, 1:06 AM  Results for orders placed or performed during the hospital encounter of 04/20/15 (from the past 24 hour(s))  Amnisure rupture of membrane (rom)     Status: None   Collection Time: 04/24/15  1:05 AM  Result Value Ref Range   Amnisure ROM POSITIVE    ADDENDUM: Results shared w/ couple   P: Initiate IV Obtain formal bedside u/s Pain meds prn No change in plan for interventions   Farrel Gordon, CNM 04/24/15, 1:56 AM

## 2015-04-24 NOTE — Progress Notes (Signed)
I learned about Megan Turner's change in status of baby "B" while making rounds on the third floor. Her husband was bedside and massaging her legs. She was very thankful for all Chaplain support Megan Turner and [name]). 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 She was thankful for visit and prayer. Offered support as needed. Ernest Haber Chaplain   04/24/15 0515  Clinical Encounter Type  Visited With Patient and family together

## 2015-04-24 NOTE — Progress Notes (Addendum)
Pt w/ intermittent dull low back and abdominal pain, but declines further medication due to "possibly delivering." States still holding out hope that she will not deliver.  Pt to be transferred back to L&D per house coverage due to AICU's increased census.   Farrel Gordon, CNM 04/24/15, 4:53 AM

## 2015-04-24 NOTE — Progress Notes (Signed)
Transfer discussed with patient and her husband, understanding voiced. Report called to Vennie Homans RN in birthing suites. Patient transferred via bed to room 172.

## 2015-04-25 ENCOUNTER — Inpatient Hospital Stay (HOSPITAL_COMMUNITY): Payer: BC Managed Care – PPO

## 2015-04-25 LAB — CBC WITH DIFFERENTIAL/PLATELET
BASOS PCT: 0 % (ref 0–1)
Basophils Absolute: 0 10*3/uL (ref 0.0–0.1)
EOS ABS: 0.3 10*3/uL (ref 0.0–0.7)
Eosinophils Relative: 2 % (ref 0–5)
HCT: 33.3 % — ABNORMAL LOW (ref 36.0–46.0)
Hemoglobin: 11.6 g/dL — ABNORMAL LOW (ref 12.0–15.0)
Lymphocytes Relative: 18 % (ref 12–46)
Lymphs Abs: 2.2 10*3/uL (ref 0.7–4.0)
MCH: 27.8 pg (ref 26.0–34.0)
MCHC: 34.8 g/dL (ref 30.0–36.0)
MCV: 79.9 fL (ref 78.0–100.0)
Monocytes Absolute: 0.5 10*3/uL (ref 0.1–1.0)
Monocytes Relative: 4 % (ref 3–12)
Neutro Abs: 9.3 10*3/uL — ABNORMAL HIGH (ref 1.7–7.7)
Neutrophils Relative %: 76 % (ref 43–77)
Platelets: 261 10*3/uL (ref 150–400)
RBC: 4.17 MIL/uL (ref 3.87–5.11)
RDW: 12.9 % (ref 11.5–15.5)
WBC: 12.3 10*3/uL — AB (ref 4.0–10.5)

## 2015-04-25 NOTE — Progress Notes (Signed)
This is a Spiritual Care follow up visit. Megan Turner reports she is in a positive mood and has a positive outlook. She attributes her outlook to chaplains visits. She asked for a return visit on 1 May (tomorrow) as she had calls to make on her cell phone. She also mentioned that staff had mistakenly taken some coloring pages [name] had given her, and requested more. They help calm her mind and give her a positive outlook. The chaplain will deliver more to Megan Madruga at his next visit.  Sallee Lange. Dilara Navarrete, [place]

## 2015-04-25 NOTE — Progress Notes (Addendum)
Hospital day # 5 pregnancy at [redacted]w[redacted]d--PPROM Twin A, with delivery 04/20/15, Twin B remains viable.  Twin A placenta remains in-situ.  S:  Doing well.  Small amount mucus d/c today, but no true leaking.  Had several soft BMs today.  Still using bedpan--declines BRP.      Perception of contractions: None      Vaginal bleeding: None       Vaginal discharge:  mucousy and no significant change  O: BP 124/86 mmHg  Pulse 97  Temp(Src) 98.2 F (36.8 C) (Oral)  Resp 18  Ht 5\' 4"  (1.626 m)  Wt 106.595 kg (235 lb)  BMI 40.32 kg/m2  SpO2 100%  LMP 07/15/2014      Fetal tracings:  FHR 157 by doppler      Contractions: None        Uterus non-tender      Extremities: no significant edema and no signs of DVT          Labs:   CBC Latest Ref Rng 04/25/2015 04/22/2015 04/20/2015  WBC 4.0 - 10.5 K/uL 12.3(H) 10.2 11.9(H)  Hemoglobin 12.0 - 15.0 g/dL 11.6(L) 10.3(L) 11.1(L)  Hematocrit 36.0 - 46.0 % 33.3(L) 30.5(L) 32.3(L)  Platelets 150 - 400 K/uL 261 217 267          Meds:  . amoxicillin  500 mg Oral 3 times per day  . docusate sodium  100 mg Oral Daily  . prenatal multivitamin  1 tablet Oral Q1200  On day 5 of 5 po antibiotic course   [place] today:  AFI subjectively normal, cervix 1.9 with funnel length 1.6, funnel width 0.8  A: [redacted]w[redacted]d with PPROM       Cervical shortening/funneling     Stable  P: Continue current plan of care      Upcoming tests/treatments:  T&S q 72 hours      MDs will follow  LATHAM, [place], [place] 04/25/2015 2:08 PM   Pt had several questions.  She still desires to proceed with this plan of care.   I spoke with Dr Burnett Harry who feels she is not a candidate for progesterone therapy in any form Continue care

## 2015-04-26 NOTE — Progress Notes (Signed)
Follow up visit to Megan Turner. Chaplain delivered coloring sheets to replace those misplaced earlier in the week. Discussion of the pain and joy scale and evaluation of her status of bearing with the pain of the death of one of her twins and the gift of her second child still viable. Chaplain follow ups indicated as important to Megan Turner to keep her balance spiritually and emotionally.  Sallee Lange. Jaclin Finks, [place]

## 2015-04-26 NOTE — Progress Notes (Signed)
Hospital day # 6 pregnancy at [redacted]w[redacted]d--PPROM Twin A, with delivery 04/20/15, Twin B remains viable. Twin A placenta remains in-situ.  Subjective:  Patient just had a visit with the chaplain.  She reports no complaints. She denies fevers or chills or abdominal pain or leakage of fluid.  She is using bedpan and keeping ambulation at a minimum.  She reports she is coping well, as best as she can. She is expecting some visitors today.      Objective: I have reviewed patient's vital signs. Filed Vitals:   04/26/15 0000 04/26/15 0529 04/26/15 1042 04/26/15 1424  BP: 111/65 108/78 130/67 126/79  Pulse: 90 98 71 101  Temp: 98.5 F (36.9 C) 98.9 F (37.2 C) 98.4 F (36.9 C) 97.7 F (36.5 C)  TempSrc: Oral Oral Oral Oral  Resp: 17 23 20 20   Height:      Weight:      SpO2: 100% 100% 99% 99%   General: alert, cooperative and no distress Resp: clear to auscultation bilaterally Cardio: regular rate and rhythm, S1, S2 normal, no murmur, click, rub or gallop GI: soft, non-tender; bowel sounds normal; no masses,  no organomegaly Extremities: Warm and well-perfused, no edema no calf tenderness  FHB today AM by doppler: 152  Current facility-administered medications:  .  acetaminophen (TYLENOL) tablet 650 mg, 650 mg, Oral, Q4H PRN, Venus Standard, CNM, 650 mg at 04/21/15 2128 .  amoxicillin (AMOXIL) capsule 500 mg, 500 mg, Oral, 3 times per day, Venus Standard, CNM, 500 mg at 04/26/15 1329 .  calcium carbonate (TUMS - dosed in mg elemental calcium) chewable tablet 400 mg of elemental calcium, 2 tablet, Oral, Q4H PRN, Venus Standard, CNM .  docusate sodium (COLACE) capsule 100 mg, 100 mg, Oral, Daily, Venus Standard, CNM, 100 mg at 04/26/15 1047 .  nalbuphine (NUBAIN) injection 10 mg, 10 mg, Intravenous, Q2H PRN, Farrel Gordon, CNM .  prenatal multivitamin tablet 1 tablet, 1 tablet, Oral, Q1200, Venus Standard, CNM, 1 tablet at 04/26/15 1238 .  zolpidem (AMBIEN) tablet 5 mg, 5 mg, Oral, QHS PRN,  Venus Standard, CNM, 5 mg at 04/26/15 0055   CBC    Component Value Date/Time   WBC 12.3* 04/25/2015 0510   RBC 4.17 04/25/2015 0510   HGB 11.6* 04/25/2015 0510   HCT 33.3* 04/25/2015 0510   PLT 261 04/25/2015 0510   MCV 79.9 04/25/2015 0510   MCH 27.8 04/25/2015 0510   MCHC 34.8 04/25/2015 0510   RDW 12.9 04/25/2015 0510   LYMPHSABS 2.2 04/25/2015 0510   MONOABS 0.5 04/25/2015 0510   EOSABS 0.3 04/25/2015 0510   BASOSABS 0.0 04/25/2015 0510   Assessment/Plan: 39 year old G4 para 0030 at 16 weeks 5 days EGA with PPROM and delivery of twin A, with twin A placenta in situ, and viable twin B, stable  Continue with latency antibiotics, last day.  Continue with close monitoring of signs and symptoms of infection  Continue with current care  Discussed with patient that she could ambulate to use bathroom but patient wishes to minimize ambulation. While in bed she needs to keep her SCDs on and also move her legs to help with extremities  Circulation.   LOS: 6 days    [facility] 04/26/2015, 2:21 PM

## 2015-04-27 ENCOUNTER — Encounter (HOSPITAL_COMMUNITY): Payer: Self-pay

## 2015-04-27 MED ORDER — NON FORMULARY: 2.0000 | Freq: Every day | Status: DC

## 2015-04-27 MED ORDER — DOCOSAHEXAENOIC ACID 200 MG PO CAPS
2.0000 | ORAL_CAPSULE | Freq: Every day | ORAL | Status: DC
  Administered 2015-04-28 – 2015-05-23 (×26): 400 mg via ORAL

## 2015-04-27 NOTE — Progress Notes (Signed)
Antenatal Nutrition Assessment:  Currently  16 4/[redacted] weeks gestation, with PROM. Height  64 "  Weight 235 lbs  pre-pregnancy weight 230 lbs .  Pre-pregnancy  BMI 37.9  IBW 120 lbs Total weight gain 15.lbs Weight gain goals 11-20 lbs Estimated needs: 2100-2300 kcal/day, 90-100 grams protein/day, 2.3 liters fluid/day  Regular diet tolerated well, appetite good. Pt has Hx of nausea, no vomiting in first trmester Current diet prescription will provide for increased needs. May order double protein portions, snacks TID and from retail  No abnormal nutrition related labs  Nutrition Dx: Increased nutrient needs r/t pregnancy and fetal growth requirements aeb [redacted] weeks gestation.  No educational needs assessed at this time.  Weyman Rodney M.Fredderick Severance LDN Neonatal Nutrition Support Specialist/RD III Pager [phone]

## 2015-04-27 NOTE — Progress Notes (Signed)
Patient claims, " I feel some drainage when I woke up."  Very scant clear vaginal drainage, no odor and patient denies any abdominal or back pain.  Clean, dry pad applied to continue to observe.

## 2015-04-27 NOTE — Progress Notes (Signed)
Follow up. Patient showed me her knitted dish cloth that she is working on and talked about the coloring. Two other guests were in the room at the time of the visit. Please let the chaplains know if patient needs more coloring sheets.  Megan Gore, PhD, [place]

## 2015-04-27 NOTE — Progress Notes (Signed)
Ur chart review completed.  

## 2015-04-27 NOTE — Progress Notes (Addendum)
Hospital day # 7 pregnancy at [redacted]w[redacted]d SVD of Baby A on 04/20/15, IUP of 17.4 Baby B  S:  Perception of contractions: none      Vaginal bleeding: none now       Vaginal discharge:  no significant change      In very good spirits  O: BP 131/77 mmHg  Pulse 112  Temp(Src) 98.8 F (37.1 C) (Oral)  Resp 18  Ht 5\' 4"  (1.626 m)  Wt 235 lb (106.595 kg)  BMI 40.32 kg/m2  SpO2 98%  LMP 07/15/2014      Fetal tracings:baby B 161      Contractions:  none       Uterus gravid and non-tender      Extremities: extremities normal, atraumatic, no cyanosis or edema and no significant edema and no signs of DVT          Labs:  No results found for this or any previous visit (from the past 24 hour(s)).       Meds:  Current facility-administered medications:  .  acetaminophen (TYLENOL) tablet 650 mg, 650 mg, Oral, Q4H PRN, Venus Standard, CNM, 650 mg at 04/21/15 2128 .  amoxicillin (AMOXIL) capsule 500 mg, 500 mg, Oral, 3 times per day, Venus Standard, CNM, 500 mg at 04/27/15 1343 .  calcium carbonate (TUMS - dosed in mg elemental calcium) chewable tablet 400 mg of elemental calcium, 2 tablet, Oral, Q4H PRN, Venus Standard, CNM .  docusate sodium (COLACE) capsule 100 mg, 100 mg, Oral, Daily, Venus Standard, CNM, 100 mg at 04/27/15 0955 .  nalbuphine (NUBAIN) injection 10 mg, 10 mg, Intravenous, Q2H PRN, Farrel Gordon, CNM .  prenatal multivitamin tablet 1 tablet, 1 tablet, Oral, Q1200, Venus Standard, CNM, 1 tablet at 04/27/15 1138 .  zolpidem (AMBIEN) tablet 5 mg, 5 mg, Oral, QHS PRN, Venus Standard, CNM, 5 mg at 04/26/15 0055  A: [redacted]w[redacted]d with Baby B     stable     Fetal tracings: 161     Uterus non-tender      Extremities: DTR 1+, no clonus, no edema   P: Continue current plan of care      Consulted with Dr. Alesia Richards for plan on care      MDs will follow  Venus Standard, CNM, MSN 04/27/2015. 2:35 PM  I saw and examined patient at bedside and agree with above findings assessment and plan. Patient  desires to resume taking her prenatal DHA, may use her own medication, RN alerted.  Dr. Alesia Richards.

## 2015-04-28 NOTE — Progress Notes (Signed)
Hospital day # 8 pregnancy at [redacted]w[redacted]d--PPROM Twin A, with delivery 04/20/15, Twin B remains viable. Twin A placenta remains in-situ.  S:  Doing well, in good spirits.  Doing craft projects, coloring, knitting      Perception of contractions: None      Vaginal bleeding: None       Vaginal discharge:  None at present  Using bedpan for all toileting--"afraid to get up to BR".  O: BP 124/78 mmHg  Pulse 109  Temp(Src) 98.6 F (37 C) (Oral)  Resp 18  Ht 5\' 4"  (1.626 m)  Wt 101.606 kg (224 lb)  BMI 38.43 kg/m2  SpO2 99%  LMP 07/15/2014      FHR:  150 by doppler at 1230      Contractions:   None per patient      Uterus non-tender      Extremities: no significant edema and no signs of DVT          Labs:  T&S        Meds:  . Docosahexaenoic Acid  2 capsule Oral Q1200  . docusate sodium  100 mg Oral Daily  . prenatal multivitamin  1 tablet Oral Q1200    A: [redacted]w[redacted]d with PPROM Twin A, with delivery 04/20/15, Twin B remains viable. Twin A placenta remains in-situ.      Stable  P: Continue current plan of care      Upcoming tests/treatments:  T&S q 72 hours      MDs will follow  Donnel Saxon CNM, [place] 04/28/15 4:15p 04/28/2015 4:26 PM

## 2015-04-29 NOTE — Progress Notes (Signed)
Hospital day # 9 pregnancy at [redacted]w[redacted]d--with SP SVD/IUFD of baby A at 16.1 on 4/25.  S:  Perception of contractions: none      Vaginal bleeding: none now       Vaginal discharge:  no significant change  O: BP 107/67 mmHg  Pulse 94  Temp(Src) 98.9 F (37.2 C) (Oral)  Resp 18  Ht 5\' 4"  (1.626 m)  Wt 224 lb (101.606 kg)  BMI 38.43 kg/m2  SpO2 97%  LMP 07/15/2014      Fetal doppler: 157      Contractions:  none       Uterus gravid and non-tender      Extremities: extremities normal, atraumatic, no cyanosis or edema and no significant edema and no signs of DVT          Labs:  No results found for this or any previous visit (from the past 24 hour(s)).       Meds:  Current facility-administered medications:  .  acetaminophen (TYLENOL) tablet 650 mg, 650 mg, Oral, Q4H PRN, Samella Lucchetti, CNM, 650 mg at 04/21/15 2128 .  calcium carbonate (TUMS - dosed in mg elemental calcium) chewable tablet 400 mg of elemental calcium, 2 tablet, Oral, Q4H PRN, Oluwadamilare Tobler, CNM .  Docosahexaenoic Acid CAPS 400 mg, 2 capsule, Oral, Q1200, Waymon Amato, MD, 400 mg at 04/29/15 1324 .  docusate sodium (COLACE) capsule 100 mg, 100 mg, Oral, Daily, Madeline Bebout, CNM, 100 mg at 04/29/15 0924 .  nalbuphine (NUBAIN) injection 10 mg, 10 mg, Intravenous, Q2H PRN, Farrel Gordon, CNM .  prenatal multivitamin tablet 1 tablet, 1 tablet, Oral, Q1200, Lanelle Lindo, CNM, 1 tablet at 04/29/15 1325 .  zolpidem (AMBIEN) tablet 5 mg, 5 mg, Oral, QHS PRN, Gabryelle Whitmoyer, CNM, 5 mg at 04/26/15 0055  A: [redacted]w[redacted]d with Baby B     stable     Fetal tracings: 157     Contractions: none     Uterus non-tender      Extremities: DTR 1+, no clonus, no edema   P: Continue current plan of care      Upcoming tests/treatments:  T&S q72 hrs, [place] next week      Consult with Dr.  for plan on care      MDs will follow     Nell Gales, CNM, MSN 04/29/2015, 12:22 PM

## 2015-04-30 ENCOUNTER — Inpatient Hospital Stay (HOSPITAL_COMMUNITY): Payer: BC Managed Care – PPO

## 2015-04-30 MED ORDER — GLYCERIN (LAXATIVE) 2.1 G RE SUPP: 1.0000 | Freq: Every day | RECTAL | Status: DC | PRN

## 2015-04-30 MED ORDER — POLYETHYLENE GLYCOL 3350 17 G PO PACK
17.0000 g | PACK | Freq: Every day | ORAL | Status: DC | PRN
Start: 2015-04-30 — End: 2015-05-23
  Administered 2015-04-30: 17 g via ORAL
  Filled 2015-04-30: qty 1

## 2015-04-30 NOTE — Progress Notes (Signed)
Ur chart review completed.  

## 2015-04-30 NOTE — Progress Notes (Signed)
Day #10, 17 1/7 weeks--Delivery Twin A on 4/25, Twin A placenta in situ, Twin B viable, known cervical shortening and funneling  Called to see patient after she reported prolonged episode of vaginal and lower abdominal pressure after BM and voiding on bedpain.  Took Miralax today with results.  Denies bleeding, reports pressure sensation somewhat improved at present, but "concerned".  Issue reviewed--plan limited US at bedside for transabdominal visualization of cervical length.  Donnel Saxon, CNM 04/30/15 10:30p

## 2015-04-30 NOTE — Progress Notes (Signed)
39 y.o. year old female,at [redacted]w[redacted]d gestation.  Hospital day # 10 pregnancy at [redacted]w[redacted]d--with SP SVD/IUFD of baby A at 16.1 on 4/25.  SUBJECTIVE:  Doing well. The patient is still in good spirits.  OBJECTIVE:  BP 107/69 mmHg  Pulse 98  Temp(Src) 98.8 F (37.1 C) (Oral)  Resp 16  Ht 5\' 4"  (1.626 m)  Wt 225 lb (102.059 kg)  BMI 38.60 kg/m2  SpO2 100%  LMP 07/15/2014  Fetal Heart Tones:  Stable  Contractions:          None  Abdomen: Nontender  ASSESSMENT:  [redacted]w[redacted]d Weeks Pregnancy  Previous 20 gestation with spontaneous delivery of twin A following rupture membranes. Twin B is viable.  PLAN:  Continue in-hospital management.  Gildardo Cranker, M.D.

## 2015-05-01 LAB — CBC WITH DIFFERENTIAL/PLATELET
BASOS PCT: 0 % (ref 0–1)
Basophils Absolute: 0 10*3/uL (ref 0.0–0.1)
Eosinophils Absolute: 0.3 10*3/uL (ref 0.0–0.7)
Eosinophils Relative: 2 % (ref 0–5)
HCT: 33.8 % — ABNORMAL LOW (ref 36.0–46.0)
Hemoglobin: 11.8 g/dL — ABNORMAL LOW (ref 12.0–15.0)
Lymphocytes Relative: 15 % (ref 12–46)
Lymphs Abs: 2 10*3/uL (ref 0.7–4.0)
MCH: 27.6 pg (ref 26.0–34.0)
MCHC: 34.9 g/dL (ref 30.0–36.0)
MCV: 79.2 fL (ref 78.0–100.0)
MONO ABS: 0.7 10*3/uL (ref 0.1–1.0)
Monocytes Relative: 5 % (ref 3–12)
NEUTROS PCT: 78 % — AB (ref 43–77)
Neutro Abs: 10.1 10*3/uL — ABNORMAL HIGH (ref 1.7–7.7)
Platelets: 291 10*3/uL (ref 150–400)
RBC: 4.27 MIL/uL (ref 3.87–5.11)
RDW: 12.9 % (ref 11.5–15.5)
WBC: 13.1 10*3/uL — ABNORMAL HIGH (ref 4.0–10.5)

## 2015-05-01 NOTE — Progress Notes (Signed)
Megan Turner continues to cope well.  She is finding meaningful ways to spend her time and is allowing the experience to change her in positive ways.  She has very good support and she and her husband are helping support each other well too.  She reports that her feelings are shifting towards being more hopeful.  She is aware of our ongoing support, but please also page as needs arise.  [employer] Pager, [phone] 2:10 PM    05/01/15 1400  Clinical Encounter Type  Visited With Patient  Visit Type Spiritual support;Follow-up

## 2015-05-01 NOTE — Progress Notes (Addendum)
Hospital day # 11 pregnancy at [redacted]w[redacted]d--PPROM Twin A, with delivery 04/20/15, Twin B remains viable. Twin A placenta remains in-situ. .  S:  Very encouraged by normal cervical length on last night's [place]      Perception of contractions: none, no pelvic pressure      Vaginal bleeding: None      Vaginal discharge:  No significant change       Plans to continue BR with bedpan use--wants to avoid extra gravitational pressure on cervix.      Coping well with long-term hospitalization.  O: BP 115/75 mmHg  Pulse 106  Temp(Src) 98.5 F (36.9 C) (Oral)  Resp 22  Ht 5\' 4"  (1.626 m)  Wt 101.747 kg (224 lb 5 oz)  BMI 38.48 kg/m2  SpO2 99%  LMP 07/15/2014      Fetal tracings: FHR 157 on last check      Contractions:   None per patient      Uterus non-tender      Extremities: no significant edema and no signs of DVT, TED hose    [place] Results from 04/30/15: Cervix 3.5, largest pocket 3.95, placenta anterior (cervical length 1.9 with funneling on 04/25/15, funnel width 0.8)       Labs:   Results for orders placed or performed during the hospital encounter of 04/20/15 (from the past 24 hour(s))  CBC with Differential/Platelet     Status: Abnormal   Collection Time: 05/01/15  7:44 AM  Result Value Ref Range   WBC 13.1 (H) 4.0 - 10.5 K/uL   RBC 4.27 3.87 - 5.11 MIL/uL   Hemoglobin 11.8 (L) 12.0 - 15.0 g/dL   HCT 33.8 (L) 36.0 - 46.0 %   MCV 79.2 78.0 - 100.0 fL   MCH 27.6 26.0 - 34.0 pg   MCHC 34.9 30.0 - 36.0 g/dL   RDW 12.9 11.5 - 15.5 %   Platelets 291 150 - 400 K/uL   Neutrophils Relative % 78 (H) 43 - 77 %   Neutro Abs 10.1 (H) 1.7 - 7.7 K/uL   Lymphocytes Relative 15 12 - 46 %   Lymphs Abs 2.0 0.7 - 4.0 K/uL   Monocytes Relative 5 3 - 12 %   Monocytes Absolute 0.7 0.1 - 1.0 K/uL   Eosinophils Relative 2 0 - 5 %   Eosinophils Absolute 0.3 0.0 - 0.7 K/uL   Basophils Relative 0 0 - 1 %   Basophils Absolute 0.0 0.0 - 0.1 K/uL         Meds:  . Docosahexaenoic Acid  2 capsule Oral Q1200   . docusate sodium  100 mg Oral Daily  . prenatal multivitamin  1 tablet Oral Q1200    A: [redacted]w[redacted]d with PPROM Twin A, with delivery 04/20/15, Twin B remains viable. Twin A placenta remains in-situ. Stable  P: Continue current plan of care, with inpatient management.      Upcoming tests/treatments: None      MDs will follow  Donnel Saxon CNM, [place] 05/01/2015 12:17 PM  I saw and examined patient and agree with above findings, assessment and plan.  Dr. Alesia Richards.

## 2015-05-02 ENCOUNTER — Encounter (HOSPITAL_COMMUNITY): Payer: Self-pay | Admitting: *Deleted

## 2015-05-02 MED ORDER — OXYCODONE-ACETAMINOPHEN 5-325 MG PO TABS
1.0000 | ORAL_TABLET | Freq: Four times a day (QID) | ORAL | Status: DC | PRN
  Administered 2015-05-02 – 2015-05-15 (×5): 1 via ORAL
  Filled 2015-05-02 (×5): qty 1

## 2015-05-02 NOTE — Progress Notes (Addendum)
Hospital day # 12 pregnancy at [redacted]w[redacted]d--.  SP IUP of di/di twins.  SP PPROM - SVD/IUFD of twin A on 04/20/15 at 16.1  S:  Perception of contractions: none      Vaginal bleeding: none now       Vaginal discharge:  no significant change      Concerned about off and on constipation.    O: BP 121/75 mmHg  Pulse 100  Temp(Src) 98.3 F (36.8 C) (Oral)  Resp 20  Ht 5\' 4"  (1.626 m)  Wt 223 lb 0.9 oz (101.178 kg)  BMI 38.27 kg/m2  SpO2 99%  LMP 07/15/2014      Fetal tracings:154      Contractions:  none       Uterus gravid and non-tender      Extremities: no significant edema and no signs of DVT          Labs:  No results found for this or any previous visit (from the past 24 hour(s)).       Meds:  Current facility-administered medications:  .  acetaminophen (TYLENOL) tablet 650 mg, 650 mg, Oral, Q4H PRN, Venus Standard, CNM, 650 mg at 05/01/15 1941 .  calcium carbonate (TUMS - dosed in mg elemental calcium) chewable tablet 400 mg of elemental calcium, 2 tablet, Oral, Q4H PRN, Venus Standard, CNM .  Docosahexaenoic Acid CAPS 400 mg, 2 capsule, Oral, Q1200, Waymon Amato, MD, 400 mg at 05/02/15 1129 .  docusate sodium (COLACE) capsule 100 mg, 100 mg, Oral, Daily, Venus Standard, CNM, 100 mg at 05/02/15 0927 .  Glycerin (Adult) 2.1 G suppository 1 suppository, 1 suppository, Rectal, Daily PRN, Ena Dawley, MD .  nalbuphine (NUBAIN) injection 10 mg, 10 mg, Intravenous, Q2H PRN, Farrel Gordon, CNM .  polyethylene glycol (MIRALAX / GLYCOLAX) packet 17 g, 17 g, Oral, Daily PRN, Ena Dawley, MD, 17 g at 04/30/15 1432 .  prenatal multivitamin tablet 1 tablet, 1 tablet, Oral, Q1200, Venus Standard, CNM, 1 tablet at 05/02/15 1129 .  zolpidem (AMBIEN) tablet 5 mg, 5 mg, Oral, QHS PRN, Venus Standard, CNM, 5 mg at 04/26/15 0055  [place] 05/01/15 cervical length 3.5, fluid NWL   A: [redacted]w[redacted]d with  SP IUP of di/di twins.  SP PPROM - SVD/IUFD of twin A on 04/20/15 at 16.1      stable     Fetal tracings:  reassured     Contractions: none     Uterus non-tender      Extremities: DTR 1+, no clonus,  Edema     [place] over all reassured   P: Continue current plan of care      Upcoming tests/treatments:  Possible txr to AP at 22-23 weeks      MDs will follow  Venus Standard, CNM, MSN 05/02/2015. 11:41 AM  I saw and examined patient at bedside and agree with above findings, assessment and plan.  Dr. Alesia Richards.

## 2015-05-02 NOTE — Progress Notes (Signed)
Called to the room stat, pt c/o contraction like pain.   Toco and fetal monitored placed.   Pt describe the pain as occasionally sharp in the LRQ.  Pain noticed by the patient with increase fetal movement FHT 160, appropriate for GA. VE 0.5/T/H, no vb or lof   Provider at the bedside x 30 minute, increase fetal movement w/no ctx noticed on the monitor.   Pt left in stable condition, on toco.

## 2015-05-02 NOTE — Progress Notes (Signed)
Megan Turner [id number]  Subjective: Nurse call reporting patient c/o back pain and abdominal cramping.  States patient requesting ultrasound.  In room to assess.  Patient reports pains are similar to "when I lost my other baby."  Admits that percocet did bring relief to back pain and that abdominal pain may be gas pain.   Objective:  Filed Vitals:   05/02/15 0627 05/02/15 1132 05/02/15 1829 05/02/15 2258  BP:  121/75 118/73 129/80  Pulse:  100 96 93  Temp:  98.3 F (36.8 C) 98.9 F (37.2 C) 98.7 F (37.1 C)  TempSrc:  Oral Oral Oral  Resp:  20 20 20   Height:      Weight: 101.178 kg (223 lb 0.9 oz)     SpO2:  99% 99% 100%    FHR: 158 bpm by doppler  Assessment: IUP at [redacted]w[redacted]d Anxiety Bedrest  Plan: -Reassurances given regarding fetal wellbeing -Informed that she will not receive an ultrasound for pregnancy discomforts -Informed that if spontaneous labor occurs, measures to stop are unlikely considering retained placenta -Further reassured about latest ultrasound findings including normal AFI and CL -Educated on effects of bedrest to include back pain, constipation, and increased risk for DVTs -Patient encouraged to ambulate -Patient denies feelings of depression, but does display anxiety, which is appropriate -Encouraged to contact provider with questions and concerns.  [name], CNM 05/02/2015 11:48 PM

## 2015-05-03 LAB — URINE MICROSCOPIC-ADD ON

## 2015-05-03 LAB — URINALYSIS, ROUTINE W REFLEX MICROSCOPIC
BILIRUBIN URINE: NEGATIVE
GLUCOSE, UA: NEGATIVE mg/dL
HGB URINE DIPSTICK: NEGATIVE
Ketones, ur: 40 mg/dL — AB
Nitrite: NEGATIVE
PROTEIN: NEGATIVE mg/dL
Specific Gravity, Urine: 1.025 (ref 1.005–1.030)
Urobilinogen, UA: 0.2 mg/dL (ref 0.0–1.0)
pH: 5.5 (ref 5.0–8.0)

## 2015-05-03 MED ORDER — DOCUSATE SODIUM 100 MG PO CAPS
100.0000 mg | ORAL_CAPSULE | Freq: Three times a day (TID) | ORAL | Status: DC
  Administered 2015-05-03 – 2015-05-23 (×58): 100 mg via ORAL
  Filled 2015-05-03 (×59): qty 1

## 2015-05-03 MED ORDER — IBUPROFEN 600 MG PO TABS
600.0000 mg | ORAL_TABLET | Freq: Four times a day (QID) | ORAL | Status: DC | PRN
  Administered 2015-05-03: 600 mg via ORAL
  Filled 2015-05-03: qty 1

## 2015-05-03 NOTE — Progress Notes (Signed)
Pt complained of cramps and abdomen discomfort. 2 RN's and ROB nurse assessed pt. Pt appears to not have any tightening of abdomen or any other signs of infection. Pt denies leaking of fluid, bloody discharge, or pressure in vagina. Midwife notified and will come assess patient.

## 2015-05-03 NOTE — Plan of Care (Signed)
Problem: Phase II Progression Outcomes Goal: Progress activity as tolerated unless otherwise ordered Outcome: Completed/Met Date Met:  05/03/15 Patient is now allowed to be up to the bathroom or in a w/c to go outside for fresh air.

## 2015-05-03 NOTE — Plan of Care (Signed)
Problem: Phase I Progression Outcomes Goal: Contractions < 5-6/hour Outcome: Not Progressing Not really able to tell if patient is having uc's.Patient complains at times that she can't tell if her discomfort is gas or uc's.FHR has been stable.No leaking any fluid or bleeding vaginally.

## 2015-05-03 NOTE — Progress Notes (Signed)
Hospital day # 13 pregnancy SP IUP of di/di twins. Pt reports per the IVF transfer she is 18.1 today, she is SP PPROM - SVD/IUFD of twin A on 04/20/15 at 16.2 based on the IVF transfer date.   .  S:  Perception of contractions: none      Vaginal bleeding: none now       Vaginal discharge:  no significant change     c/o irritating pain in the LRQ that increases with eating  O: BP 119/89 mmHg  Pulse 106  Temp(Src) 99 F (37.2 C) (Oral)  Resp 20  Ht 5\' 4"  (1.626 m)  Wt 221 lb 6 oz (100.415 kg)  BMI 37.98 kg/m2  SpO2 99%  LMP 07/15/2014      Fetal tracings:154      Contractions:  none       Uterus gravid and non-tender      Extremities: no significant edema and no signs of DVT          Labs:  No results found for this or any previous visit (from the past 24 hour(s)).       Meds:  Current facility-administered medications:  .  acetaminophen (TYLENOL) tablet 650 mg, 650 mg, Oral, Q4H PRN, Lounette Sloan, CNM, 650 mg at 05/01/15 1941 .  calcium carbonate (TUMS - dosed in mg elemental calcium) chewable tablet 400 mg of elemental calcium, 2 tablet, Oral, Q4H PRN, Clancy Mullarkey, CNM .  Docosahexaenoic Acid CAPS 400 mg, 2 capsule, Oral, Q1200, Waymon Amato, MD, 400 mg at 05/02/15 1129 .  docusate sodium (COLACE) capsule 100 mg, 100 mg, Oral, Daily, Auburn Hert, CNM, 100 mg at 05/03/15 1010 .  Glycerin (Adult) 2.1 G suppository 1 suppository, 1 suppository, Rectal, Daily PRN, Ena Dawley, MD .  nalbuphine (NUBAIN) injection 10 mg, 10 mg, Intravenous, Q2H PRN, Farrel Gordon, CNM .  oxyCODONE-acetaminophen (PERCOCET/ROXICET) 5-325 MG per tablet 1-2 tablet, 1-2 tablet, Oral, Q6H PRN, Gavin Pound, CNM, 1 tablet at 05/03/15 0513 .  polyethylene glycol (MIRALAX / GLYCOLAX) packet 17 g, 17 g, Oral, Daily PRN, Ena Dawley, MD, 17 g at 04/30/15 1432 .  prenatal multivitamin tablet 1 tablet, 1 tablet, Oral, Q1200, Sema Stangler, CNM, 1 tablet at 05/02/15 1129 .  zolpidem (AMBIEN) tablet  5 mg, 5 mg, Oral, QHS PRN, Bellami Farrelly, CNM, 5 mg at 04/26/15 0055  A: [redacted]w[redacted]d remaining twin at 18.1 per the IVF transfer date     stable     Fetal tracings: reassuring     Contractions: none     Uterus non-tender      Extremities: DTR 1+, no clonus, no edema  P: Continue current plan of care      Upcoming tests/treatments:  BMZ at 22-23 weeks      Consult with Dr. Mancel Bale  for plan on care      MDs will follow     Keiton Cosma, CNM, MSN 05/03/2015. 11:44 AM

## 2015-05-03 NOTE — Progress Notes (Signed)
Called to room by primary care nurse.  Pt c/o cramping and thinks she having ctx.  CNM Mickael Mcnutt and Dr Mancel Bale to the bedside for eval.  Pt described cramping as mild, in the middle-suprapubic and sporadic.  She denies VB or LOF w/+FM.  FHT 154  Plan IBP 600 q6 prn to end at viability UA, clean catch Urine culture Increase PO fluids Colace TID Wheel chair ride BR BRP Pt may shower Con't FHT q 8 and per pt request

## 2015-05-04 ENCOUNTER — Encounter (HOSPITAL_COMMUNITY): Payer: Self-pay | Admitting: *Deleted

## 2015-05-04 MED ORDER — IBUPROFEN 800 MG PO TABS
800.0000 mg | ORAL_TABLET | Freq: Three times a day (TID) | ORAL | Status: DC
  Administered 2015-05-04 – 2015-05-13 (×10): 800 mg via ORAL
  Filled 2015-05-04 (×15): qty 1

## 2015-05-04 NOTE — Progress Notes (Signed)
Ur chart review completed.  

## 2015-05-04 NOTE — Progress Notes (Signed)
40 y.o. year old female,at [redacted]w[redacted]d gestation. Hospital day # 14 SP PPROM - SVD/IUFD of twin A on 04/20/15 at 16.1  SUBJECTIVE:  Doing okay. Occasional cramping.  OBJECTIVE:  BP 133/77 mmHg  Pulse 100  Temp(Src) 98.1 F (36.7 C) (Oral)  Resp 20  Ht 5\' 4"  (1.626 m)  Wt 221 lb 6 oz (100.415 kg)  BMI 37.98 kg/m2  SpO2 100%  LMP 07/15/2014  Fetal Heart Tones:  Stable  Contractions:          Nonpalpable  Abdomen nontender. Extremities within normal limits.  ASSESSMENT:  [redacted]w[redacted]d Weeks Pregnancy  Twin gestation  Status post rupture membrane of twin A with subsequent delivery of twin A.  Stable twin B.  Uterine contractions.  PLAN:  Use ibuprofen as needed.  Continue in-hospital observation.  Gildardo Cranker, M.D.

## 2015-05-05 ENCOUNTER — Encounter (HOSPITAL_COMMUNITY): Payer: Self-pay | Admitting: *Deleted

## 2015-05-05 LAB — CULTURE, OB URINE: Colony Count: 35000

## 2015-05-05 MED ORDER — NITROFURANTOIN MONOHYD MACRO 100 MG PO CAPS
100.0000 mg | ORAL_CAPSULE | Freq: Two times a day (BID) | ORAL | Status: AC
Start: 2015-05-05 — End: 2015-05-11
  Administered 2015-05-05 – 2015-05-11 (×14): 100 mg via ORAL
  Filled 2015-05-05 (×14): qty 1

## 2015-05-05 NOTE — MAU Note (Signed)
edc updated per request from [facility]

## 2015-05-05 NOTE — Progress Notes (Addendum)
Hospital day # 15 pregnancy at [redacted]w[redacted]d--Delivery Twin A on 4/25, Twin A placenta in situ, Twin B viable  S:  Doing well--question regarding when anatomy US will be done.      Perception of contractions: None      Vaginal bleeding: None      Vaginal discharge:  None      Had BM yesterday--"scares me when I feel pressure".       Used BR for BM yesterday without difficulty, did feel "weak" and unsteady on feet.  Reviewed option of going home with patient.   Reviewed likely management would be to d/c home until viability point, then admit for duration of pregnancy. "I would be more likely to be willing to go home later in pregnancy" after viability point achieved.  Advises me she will discuss issue with husband.  She remains very anxious about any physical sx she has, worrying about any signs of impending labor/delivery.  Feels more relieved since cervical length has improved, but still very (appropriately) concerned about risks of pregnancy loss.   O: BP 118/75 mmHg  Pulse 99  Temp(Src) 99.1 F (37.3 C) (Oral)  Resp 20  Ht 5\' 4"  (1.626 m)  Wt 99.565 kg (219 lb 8 oz)  BMI 37.66 kg/m2  SpO2 98%  Breastfeeding? No      Fetal tracings:  FHR 150      Contractions:   None      Uterus non-tender      Extremities: no significant edema and no signs of DVT          Labs:  Preliminary urine culture from 05/03/15 = 35K Ecoli, no sensitivities yet       Meds:  . Docosahexaenoic Acid  2 capsule Oral Q1200  . docusate sodium  100 mg Oral TID  . ibuprofen  800 mg Oral Q8H  . prenatal multivitamin  1 tablet Oral Q1200    A: [redacted]w[redacted]d with delivery Twin A on 4/25, Twin A placenta in situ, Twin B viable 35K Ecoli on urine, no sx   P: Continue current plan of care      Upcoming tests/treatments:  Schedule Level 2 anatomy US this week with MFM      Will consult Dr. Cletis Media regarding ? Need to treat 35K Ecoli urine culture      Support to patient for long-term hospitalization.      PT consult for  exercises for long-term hospitalization.       MDs will follow  Donnel Saxon CNM, [place] 05/05/2015 12:42 PM   Addendum: Per consult with Dr. Cletis Media, will treat + urine culture with Macrobid BID x 7 days. Review sensitivities when returned for appropriateness of treatment.  Donnel Saxon, CNM 05/05/15 3:15p  Seen and agreed

## 2015-05-05 NOTE — Progress Notes (Signed)
Patient ambulated out of bed to the bathroom with assistance of husband and also had a bowel movement during shift. Denies any pain, any abdominal cramping, and also denies vaginal bleeding or any leaking of fluid. Will continue to monitor

## 2015-05-06 ENCOUNTER — Inpatient Hospital Stay (HOSPITAL_COMMUNITY): Payer: BC Managed Care – PPO

## 2015-05-06 NOTE — Patient Instructions (Signed)
ANKLE: Circles   Move feet in a circle clockwise, then counterclockwise. Repeat 10 times twice each day.   Quad Sets   Squeeze pelvic floor and hold. Tighten top of left thigh. Hold for 3 seconds. Relax for 3 seconds. Repeat 10 times. Do 2 times a day. Repeat with other leg.    KNEE: Extension, Short Arc Quads - Supine   Place bolster under knees. Raise one leg until knee is straight. Repeat 10 times each leg, twice each day.    Butterfly, Supine   Lie on back, feet together. Lower knees toward floor. Repeat 10 times, twice each day

## 2015-05-06 NOTE — Progress Notes (Signed)
Nutrition Follow-up RE: weight loss  11 lb weight loss between 4/29 and 5/3 is likely inaccuracy in bed scale or fluid weight loss. Pt has not experienced n/v or drastically decreased appetite, that would be typical etiology of weight loss. Pt is currently consuming 3 meals per day and focusing on intake. Good appetite today.  She is allowed double protein portions, snacks TID if desired. She does not wish Ensure at this time.   Weyman Rodney M.Fredderick Severance LDN Neonatal Nutrition Support Specialist/RD III Pager [phone]

## 2015-05-06 NOTE — Progress Notes (Addendum)
Hospital day # 16 pregnancy SP IUP of di/di twins. Pt reports per the IVF transfer she is 18.4 today, she is SP PPROM - SVD/IUFD of twin A on 04/20/15 at 16.2 based on the IVF transfer date. Twin A placenta undelivered.  Reassured for Twin B  S:  Perception of contractions: none      Vaginal bleeding: none now       Vaginal discharge:  no significant change  O: BP 116/63 mmHg  Pulse 89  Temp(Src) 98.6 F (37 C) (Oral)  Resp 18  Ht 5\' 4"  (1.626 m)  Wt 222 lb (100.699 kg)  BMI 38.09 kg/m2  SpO2 100%  Breastfeeding? No      Fetal tracings:158      Contractions:  none       Uterus gravid and non-tender      Extremities: no significant edema and no signs of DVT          Labs:  No results found for this or any previous visit (from the past 24 hour(s)).       Meds:  Current facility-administered medications:  .  acetaminophen (TYLENOL) tablet 650 mg, 650 mg, Oral, Q4H PRN, Venus Standard, CNM, 650 mg at 05/05/15 0625 .  calcium carbonate (TUMS - dosed in mg elemental calcium) chewable tablet 400 mg of elemental calcium, 2 tablet, Oral, Q4H PRN, Venus Standard, CNM .  Docosahexaenoic Acid CAPS 400 mg, 2 capsule, Oral, Q1200, Waymon Amato, MD, 400 mg at 05/05/15 1326 .  docusate sodium (COLACE) capsule 100 mg, 100 mg, Oral, TID, Venus Standard, CNM, 100 mg at 05/05/15 2058 .  Glycerin (Adult) 2.1 G suppository 1 suppository, 1 suppository, Rectal, Daily PRN, Ena Dawley, MD .  ibuprofen (ADVIL,MOTRIN) tablet 600 mg, 600 mg, Oral, Q6H PRN, Venus Standard, CNM, 600 mg at 05/03/15 1949 .  ibuprofen (ADVIL,MOTRIN) tablet 800 mg, 800 mg, Oral, Q8H, Ena Dawley, MD, Stopped at 05/05/15 1924 .  nalbuphine (NUBAIN) injection 10 mg, 10 mg, Intravenous, Q2H PRN, Farrel Gordon, CNM .  nitrofurantoin (macrocrystal-monohydrate) (MACROBID) capsule 100 mg, 100 mg, Oral, Q12H, Donnel Saxon, CNM, 100 mg at 05/05/15 2058 .  oxyCODONE-acetaminophen (PERCOCET/ROXICET) 5-325 MG per tablet 1-2 tablet,  1-2 tablet, Oral, Q6H PRN, Gavin Pound, CNM, 1 tablet at 05/03/15 2235 .  polyethylene glycol (MIRALAX / GLYCOLAX) packet 17 g, 17 g, Oral, Daily PRN, Ena Dawley, MD, 17 g at 04/30/15 1432 .  prenatal multivitamin tablet 1 tablet, 1 tablet, Oral, Q1200, Venus Standard, CNM, 1 tablet at 05/05/15 1326 .  zolpidem (AMBIEN) tablet 5 mg, 5 mg, Oral, QHS PRN, Venus Standard, CNM, 5 mg at 04/26/15 0055  A: [redacted]w[redacted]d with Twin B     stable     Fetal tracings: 158     Contractions: none     Uterus non-tender      Extremities: DTR 1+, no clonus, no edema   P: Continue current plan of care      Upcoming tests/treatments:  [place] today      Possible DC to home      Consult with Dr. Mancel Bale for plan on care      MDs will follow    Venus Standard, CNM, MSN 05/06/2015. 7:53 AM  Addendum 3474  [place] report as follows FHR 153, fluid wnl, breech, anterior placenta no previa, EFW 8ox - 47%, cervix 1.9cm.  Discussion on [place] results and the possibility of going home on bedrest.  I reviewed u/s findings and discussed findings with MFM (Dr.  Brost covering for Dr. Burnett Harry today).  Will repeat u/s in 1wk to recheck cervical length and if stable, increase activity to showering and for now continue bedrest with BRP.  Pt had significant cramping over weekend but none now.  Questions answered.  If cervix stable after increasing to shower for a week, then consider home on bedrest with BRP.

## 2015-05-07 NOTE — Progress Notes (Addendum)
Hospital day # 17 pregnancy at [redacted]w[redacted]d--Delivery Twin A on 4/25, Twin A placenta in situ, Twin B viable, shortened cervix   S:  Doing well today, in good spirits.  Up to [facility] for BM--tolerating well.      Perception of contractions: none      Vaginal bleeding: None       Vaginal discharge:  None  O: BP 127/89 mmHg  Pulse 106  Temp(Src) 97.9 F (36.6 C) (Oral)  Resp 18  Ht 5\' 4"  (1.626 m)  Wt 101.266 kg (223 lb 4 oz)  BMI 38.30 kg/m2  SpO2 98%  Breastfeeding? No      FHR:  156 this am       Contractions:   None      Uterus non-tender      Extremities: SCDs on, and no significant edema and no signs of DVT          Labs:  Urine culture E coli on 5/10, sensitive to Macrobid.       Meds:  . Docosahexaenoic Acid  2 capsule Oral Q1200  . docusate sodium  100 mg Oral TID  . ibuprofen  800 mg Oral Q8H  . nitrofurantoin (macrocrystal-monohydrate)  100 mg Oral Q12H  . prenatal multivitamin  1 tablet Oral Q1200    A: [redacted]w[redacted]d with delivery Twin A on 4/25, Twin A placenta in situ, Twin B viable, cevical shortening Stable  P: Continue current plan of care--continue BR with BRP.  If cervix stable on [place], allow showering.  If cervix remains stable on 1 week f/u US after initiating shower activity, may consider d/c home on BR with BRP.      Upcoming tests/treatments:  Repeat US in 1 week for cervical length (ordered)      MDs will follow  Donnel Saxon CNM, [place] 05/07/2015 10:38 AM  Agree with above

## 2015-05-07 NOTE — Progress Notes (Signed)
Ur chart review completed.  

## 2015-05-07 NOTE — Progress Notes (Signed)
PT evaluation  Clinical impression:  Patient demonstrated understanding of all exercises and education regarding positioning and body mechanics.  Patient with little to no back discomfort at this time and utilizing appropriate positioning to avoid issues.  No further acute PT needs identified at this time.  Will sign off.  If patient on bedrest for duration of pregnancy or moves to strict bedrest, she may need PT once delivered.  Please reconsult as needed.  Thank you.    05/06/15 1548  PT Visit Information  Last PT Received On 05/06/15  Assistance Needed +1  History of Present Illness Patient admitted with Di/Di Twin pregnancy via IVF-embryo transfer presented with PPROM and subsequent SVD/IUFD of twin A on 04/20/15.  Twin A placenta undelivered. Twin B still in utero.  Patient currently on bedrest with bathroom priveleges.  Currently at 18.[redacted] weeks gestation.  Precautions  Precaution Comments bedrest with BRP  Restrictions  Weight Bearing Restrictions No  Home Living  Family/patient expects to be discharged to: Private residence  Living Arrangements Spouse/significant other  Pain Assessment  Pain Assessment No/denies pain  Cognition  Arousal/Alertness Awake/alert  Behavior During Therapy [facility] for tasks assessed/performed  Overall Cognitive Status Within Functional Limits for tasks assessed    05/06/15 1549  Upper Extremity Assessment  Upper Extremity Assessment Overall WFL for tasks assessed  Lower Extremity Assessment  Lower Extremity Assessment Overall WFL for tasks assessed  Cervical / Trunk Assessment  Cervical / Trunk Assessment Normal  Bed Mobility  Overal bed mobility Modified Independent  General bed mobility comments used railing  Transfers  Overall transfer level Modified independent  Ambulation/Gait  Ambulation/Gait assistance Modified independent (Device/Increase time)  Ambulation Distance (Feet) 10 Feet  Assistive device None  General Gait Details ambulated to  bathroom and back to bed.  No loss of balance noted.  No gait deficits noted  Gait Pattern/deviations WFL(Within Functional Limits)  Balance  Overall balance assessment No apparent balance deficits (not formally assessed)  Exercises  Exercises Antenatal;Other exercises  Antenatal Exercises  Ankle Circles/Pumps AROM;10 reps;Supine;Both  Quad Sets AROM;Both;10 reps;Supine  Short Arc Quad AROM;Both;10 reps;Supine  Hip ABduction/ADduction AROM;Both;10 reps;Supine  Sidelying Hip Flexor Stretch PROM;Right  Other Exercises  Other Exercises Discussed with patient proper positioning in bed and proper body mechanics for getting in/out of bed.  Patient really already utilizing these techniques.  PT - End of Session  Activity Tolerance Patient tolerated treatment well  Patient left in bed;with call bell/phone within reach  PT Assessment  PT Therapy Diagnosis  Generalized weakness  PT Recommendation/Assessment Patent does not need any further PT services  No Skilled PT All education completed;Patient is modified independent with all activity/mobility  PT Recommendation  Follow Up Recommendations No PT follow up  PT equipment None recommended by PT  Acute Rehab PT Goals  Patient Stated Goal carry baby to term  PT Goal Formulation All assessment and education complete, DC therapy  PT Time Calculation  PT Start Time (ACUTE ONLY) 1530  PT Stop Time (ACUTE ONLY) 1555  PT Time Calculation (min) (ACUTE ONLY) 25 min  PT General Charges  $$ ACUTE PT VISIT 1 Procedure  PT Evaluation  $Initial PT Evaluation Tier I 1 Procedure  05/07/2015 [place], PT [phone]

## 2015-05-08 MED ORDER — DEXTROSE IN LACTATED RINGERS 5 % IV SOLN
INTRAVENOUS | Status: DC
  Administered 2015-05-08 – 2015-05-09 (×3): via INTRAVENOUS

## 2015-05-08 NOTE — Progress Notes (Addendum)
Hospital day # 18 pregnancy at [redacted]w[redacted]d- SP IUP of di/di twins. Pt reports based on the IVF transfer date she is 18.6  She is SP PPROM - SVD/IUFD of twin A on 04/20/15 at 16.2 based on the IVF transfer date. Today she c/o "feeling something in my vagina" and requested a VE.  S:  Perception of contractions: none      Vaginal bleeding: none now       Vaginal discharge:  no significant change  O: BP 118/66 mmHg  Pulse 96  Temp(Src) 99 F (37.2 C) (Oral)  Resp 18  Ht 5\' 4"  (1.626 m)  Wt 223 lb 12 oz (101.492 kg)  BMI 38.39 kg/m2  SpO2 100%  Breastfeeding? No      Fetal tracings:158      Contractions:  none       Uterus gravid and non-tender      Extremities: no significant edema and no signs of DVT          Labs:  No results found for this or any previous visit (from the past 24 hour(s)).       Meds:  Current facility-administered medications:  .  acetaminophen (TYLENOL) tablet 650 mg, 650 mg, Oral, Q4H PRN, Maida Widger, CNM, 650 mg at 05/05/15 0625 .  calcium carbonate (TUMS - dosed in mg elemental calcium) chewable tablet 400 mg of elemental calcium, 2 tablet, Oral, Q4H PRN, Emmerson Shuffield, CNM .  Docosahexaenoic Acid CAPS 400 mg, 2 capsule, Oral, Q1200, Waymon Amato, MD, 400 mg at 05/08/15 1142 .  docusate sodium (COLACE) capsule 100 mg, 100 mg, Oral, TID, Jeffre Enriques, CNM, 100 mg at 05/08/15 1338 .  Glycerin (Adult) 2.1 G suppository 1 suppository, 1 suppository, Rectal, Daily PRN, Ena Dawley, MD .  ibuprofen (ADVIL,MOTRIN) tablet 600 mg, 600 mg, Oral, Q6H PRN, Shayana Hornstein, CNM, 600 mg at 05/03/15 1949 .  ibuprofen (ADVIL,MOTRIN) tablet 800 mg, 800 mg, Oral, Q8H, Ena Dawley, MD, Stopped at 05/05/15 1924 .  nalbuphine (NUBAIN) injection 10 mg, 10 mg, Intravenous, Q2H PRN, Farrel Gordon, CNM .  nitrofurantoin (macrocrystal-monohydrate) (MACROBID) capsule 100 mg, 100 mg, Oral, Q12H, Donnel Saxon, CNM, 100 mg at 05/08/15 6967 .  oxyCODONE-acetaminophen  (PERCOCET/ROXICET) 5-325 MG per tablet 1-2 tablet, 1-2 tablet, Oral, Q6H PRN, Gavin Pound, CNM, 1 tablet at 05/03/15 2235 .  polyethylene glycol (MIRALAX / GLYCOLAX) packet 17 g, 17 g, Oral, Daily PRN, Ena Dawley, MD, 17 g at 04/30/15 1432 .  prenatal multivitamin tablet 1 tablet, 1 tablet, Oral, Q1200, Joby Hershkowitz, CNM, 1 tablet at 05/08/15 1142 .  zolpidem (AMBIEN) tablet 5 mg, 5 mg, Oral, QHS PRN, Sundeep Destin, CNM, 5 mg at 04/26/15 0055  A: [redacted]w[redacted]d with baby B of SP di/di twins     stable     Fetal tracings: reassured     Contractions: none     Uterus non-tender      Extremities: DTR 1+, no clonus, no edema     VE unremarkable, 1cm/ long/firm   P: Continue current plan of care      Upcoming tests/treatments:  [place] in 1 week, increase activity to tolerate, possible DC to home in 2 week, return at      23 week for BMZ      Consult with Dr. Mancel Bale for plan on care      MDs will follow  Cote Mayabb, CNM, MSN 05/08/2015. 4:50 PM

## 2015-05-09 ENCOUNTER — Inpatient Hospital Stay (HOSPITAL_COMMUNITY): Payer: BC Managed Care – PPO | Admitting: Anesthesiology

## 2015-05-09 ENCOUNTER — Encounter (HOSPITAL_COMMUNITY): Payer: Self-pay | Admitting: Anesthesiology

## 2015-05-09 ENCOUNTER — Encounter (HOSPITAL_COMMUNITY)
Admission: AD | Disposition: A | Discharge status: Home / Self Care | Payer: Self-pay | Source: Ambulatory Visit | Attending: Obstetrics and Gynecology

## 2015-05-09 HISTORY — PX: CERVICAL CERCLAGE: SHX1329

## 2015-05-09 LAB — MRSA PCR SCREENING: MRSA by PCR: NEGATIVE

## 2015-05-09 SURGERY — CERCLAGE, CERVIX, VAGINAL APPROACH
Anesthesia: Spinal | Site: Vagina

## 2015-05-09 MED ORDER — HYDROCODONE-ACETAMINOPHEN 7.5-325 MG PO TABS: 1.0000 | ORAL_TABLET | Freq: Once | ORAL | Status: AC | PRN

## 2015-05-09 MED ORDER — LACTATED RINGERS IV SOLN
INTRAVENOUS | Status: DC | PRN
  Administered 2015-05-09: 08:00:00 via INTRAVENOUS

## 2015-05-09 MED ORDER — SODIUM CHLORIDE 0.9 % IJ SOLN
Freq: Once | INTRAMUSCULAR | Status: AC
  Administered 2015-05-09: 30 mL via VAGINAL
  Filled 2015-05-09: qty 1

## 2015-05-09 MED ORDER — MEPERIDINE HCL 25 MG/ML IJ SOLN: 6.2500 mg | INTRAMUSCULAR | Status: DC | PRN

## 2015-05-09 MED ORDER — FENTANYL CITRATE (PF) 100 MCG/2ML IJ SOLN: 25.0000 ug | INTRAMUSCULAR | Status: DC | PRN

## 2015-05-09 MED ORDER — METOCLOPRAMIDE HCL 5 MG/ML IJ SOLN: 10.0000 mg | Freq: Once | INTRAMUSCULAR | Status: AC | PRN

## 2015-05-09 MED ORDER — BUPIVACAINE IN DEXTROSE 0.75-8.25 % IT SOLN
INTRATHECAL | Status: DC | PRN
  Administered 2015-05-09: 1.2 mL via INTRATHECAL

## 2015-05-09 SURGICAL SUPPLY — 18 items
CLOTH BEACON ORANGE TIMEOUT ST (SAFETY) ×2 IMPLANT
COUNTER NEEDLE 1200 MAGNETIC (NEEDLE) IMPLANT
GLOVE BIO SURGEON STRL SZ7.5 (GLOVE) ×2 IMPLANT
GLOVE BIOGEL PI IND STRL 7.5 (GLOVE) ×1 IMPLANT
GLOVE BIOGEL PI INDICATOR 7.5 (GLOVE) ×1
GOWN STRL REUS W/TWL LRG LVL3 (GOWN DISPOSABLE) ×4 IMPLANT
NS IRRIG 1000ML POUR BTL (IV SOLUTION) ×2 IMPLANT
PACK VAGINAL MINOR WOMEN LF (CUSTOM PROCEDURE TRAY) ×2 IMPLANT
PAD OB MATERNITY 4.3X12.25 (PERSONAL CARE ITEMS) ×2 IMPLANT
PAD PREP 24X48 CUFFED NSTRL (MISCELLANEOUS) ×2 IMPLANT
SUT MERSILENE 5MM BP 1 12 (SUTURE) ×2 IMPLANT
SUT PROLENE 1 CT 1 30 (SUTURE) ×2 IMPLANT
SYR BULB IRRIGATION 50ML (SYRINGE) ×2 IMPLANT
TOWEL OR 17X24 6PK STRL BLUE (TOWEL DISPOSABLE) ×2 IMPLANT
TRAY FOLEY CATH SILVER 14FR (SET/KITS/TRAYS/PACK) ×2 IMPLANT
TUBING NON-CON 1/4 X 20 CONN (TUBING) IMPLANT
WATER STERILE IRR 1000ML POUR (IV SOLUTION) ×2 IMPLANT
YANKAUER SUCT BULB TIP NO VENT (SUCTIONS) IMPLANT

## 2015-05-09 NOTE — Progress Notes (Signed)
Dr. Charlesetta Garibaldi notified at this time per patients request. Patient desires cerclage.

## 2015-05-09 NOTE — Anesthesia Postprocedure Evaluation (Signed)
  Anesthesia Post-op Note  Patient: Megan Turner  Procedure(s) Performed: Procedure(s): CERCLAGE CERVICAL (N/A)  Patient Location: PACU  Anesthesia Type:Spinal  Level of Consciousness: awake, alert  and oriented  Airway and Oxygen Therapy: Patient Spontanous Breathing  Post-op Pain: none  Post-op Assessment: Post-op Vital signs reviewed, Patient's Cardiovascular Status Stable, Respiratory Function Stable, Patent Airway, No signs of Nausea or vomiting, Pain level controlled, No headache and No backache  Post-op Vital Signs: Reviewed and stable  Last Vitals:  Filed Vitals:   05/09/15 1045  BP:   Pulse: 93  Temp:   Resp: 20    Complications: No apparent anesthesia complications

## 2015-05-09 NOTE — Op Note (Signed)
Preop Diagnosis: 1.18 6/7wks 2.PPROM with PTD of Twin A 3.Incompetent cervix   Postop Diagnosis: 1.18 6/7wks 2.PPROM with PTD of Twin A 3.Incompetent cervix   Procedure: CERCLAGE CERVICAL   Anesthesia: Spinal   Anesthesiologist: Josephine Igo, MD   Attending: Everett Graff, MD   Assistant: N/a  Findings: Small cervical polyp  Pathology: N/a  Fluids: See flowsheet  UOP: See flowsheet  EBL: 68HF  Complications: None  Procedure:Then patient was taken to the operating room after the risks, benefits and alternatives discussed with the patient and consent signed and witnessed.  The patient was given a spinal per anesthesia and placed in the dorsal lithotomy position.  The patient was prepped and draped in the usual sterile fashion.  A cervical cerclage stitch was placed using Mersilene and the knot was tied anteriorly on the cervix.  Clindamycin douche was performed.  Membranes remained intact and post procedure fetal heart rate was 150s.  Sponge, lap and needle count was correct and the patient was transferred to the recovery room in good condition.

## 2015-05-09 NOTE — Progress Notes (Signed)
Saw patient in PACU--doing well, pleased to have cerclage. Understands pregnancy outcome still uncertain, but remains hopeful.  Filed Vitals:   05/09/15 1000 05/09/15 1015 05/09/15 1030 05/09/15 1045  BP: 115/76 123/78 117/68   Pulse: 88 85 85 93  Temp:      TempSrc:      Resp: 17 16 16 20   Height:      Weight:      SpO2: 100% 97% 99% 100%   Continue current care.  Donnel Saxon, CNM 05/09/15 10:15a

## 2015-05-09 NOTE — Progress Notes (Signed)
Patient ID: Pauleen Goleman, female   DOB: [birth year], 39 y.o.   MRN: [id number] Shi Grose is a 39 y.o. G5P0030 at [redacted]w[redacted]d   Subjective: No complaints. Pt notified RN last night that she wanted to proceed with cerclage and is ready for that today and has been NPO after midnight.  Objective: BP 133/87 mmHg  Pulse 97  Temp(Src) 98.6 F (37 C) (Oral)  Resp 18  Ht 5\' 4"  (1.626 m)  Wt 102.059 kg (225 lb)  BMI 38.60 kg/m2  SpO2 100%  Breastfeeding? 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  Lengthy discussion yesterday evening about 6:30 -7p discussing possibility of cerclage.  Pt had been googling a lot about cerclage and was very interested in it.  Husband was present for most of discussion.  Many questions answered.  I informed them that it is highly risky and there is no data to support placement and the risks include and are not limited to loss of pregnancy and infection.  I discussed the circumstances where cerclage has been successful.  The patient and husband were going to continue conversation after I left and make a decision.   With her being stable at this point, I am not opposed to placing cerclage if that is what she wants.  She and her husband seem to understand very cleary the circumstances. Deleah Tison Y 05/09/2015, 8:10 AM

## 2015-05-09 NOTE — Anesthesia Preprocedure Evaluation (Signed)
Anesthesia Evaluation  Patient identified by MRN, date of birth, ID band Patient awake    Reviewed: Allergy & Precautions, NPO status , Patient's Chart, lab work & pertinent test results  Airway Mallampati: III  TM Distance: >3 FB Neck ROM: Full    Dental no notable dental hx. (+) Teeth Intact   Pulmonary former smoker,  breath sounds clear to auscultation  Pulmonary exam normal       Cardiovascular negative cardio ROS Normal cardiovascular examRhythm:Regular Rate:Normal     Neuro/Psych negative neurological ROS  negative psych ROS   GI/Hepatic negative GI ROS, Neg liver ROS,   Endo/Other  Morbid obesity  Renal/GU negative Renal ROS  negative genitourinary   Musculoskeletal negative musculoskeletal ROS (+)   Abdominal (+) + obese,   Peds  Hematology  (+) anemia ,   Anesthesia Other Findings   Reproductive/Obstetrics Incompetent cervix                             Anesthesia Physical Anesthesia Plan  ASA: III  Anesthesia Plan: Spinal   Post-op Pain Management:    Induction:   Airway Management Planned: Natural Airway  Additional Equipment:   Intra-op Plan:   Post-operative Plan:   Informed Consent: I have reviewed the patients History and Physical, chart, labs and discussed the procedure including the risks, benefits and alternatives for the proposed anesthesia with the patient or authorized representative who has indicated his/her understanding and acceptance.     Plan Discussed with: Anesthesiologist, CRNA and Surgeon  Anesthesia Plan Comments:         Anesthesia Quick Evaluation

## 2015-05-09 NOTE — Transfer of Care (Signed)
Immediate Anesthesia Transfer of Care Note  Patient: Megan Turner  Procedure(s) Performed: Procedure(s): CERCLAGE CERVICAL (N/A)  Patient Location: PACU  Anesthesia Type:Spinal  Level of Consciousness: awake and alert   Airway & Oxygen Therapy: Patient Spontanous Breathing  Post-op Assessment: Report given to RN and Post -op Vital signs reviewed and stable  Post vital signs: Reviewed  Last Vitals:  Filed Vitals:   05/09/15 0920  BP:   Pulse: 86  Temp: 36.8 C  Resp:     Complications: No apparent anesthesia complications

## 2015-05-09 NOTE — Anesthesia Procedure Notes (Signed)
Spinal Patient location during procedure: OR Start time: 05/09/2015 8:24 AM Staffing Anesthesiologist: Josephine Igo Performed by: anesthesiologist  Preanesthetic Checklist Completed: patient identified, site marked, surgical consent, pre-op evaluation, timeout performed, IV checked, risks and benefits discussed and monitors and equipment checked Spinal Block Patient position: sitting Prep: site prepped and draped and DuraPrep Patient monitoring: heart rate, cardiac monitor, continuous pulse ox and blood pressure Approach: midline Location: L3-4 Injection technique: single-shot Needle Needle type: Sprotte  Needle gauge: 24 G Needle length: 9 cm Needle insertion depth: 6 cm Assessment Sensory level: T8 Additional Notes Patient tolerated procedure well. Adequate sensory level.

## 2015-05-10 NOTE — Progress Notes (Signed)
Hospital day # 20 pregnancy at [redacted]w[redacted]d--19 weeks, PPROM and PTD Twin A, Twin B still viable, incompetent cervix, s/p cerclage 05/09/15  S:  Doing well--no cramping, did have small amount pinkish d/c yesterday after cerclage.  No leaking, no bleeding this am.          O: BP 126/82 mmHg  Pulse 106  Temp(Src) 98.7 F (37.1 C) (Oral)  Resp 20  Ht 5\' 4"  (1.626 m)  Wt 103.08 kg (227 lb 4 oz)  BMI 38.99 kg/m2  SpO2 100%  Breastfeeding? No      FHR:  155 at 0806      Contractions:   None      Uterus non-tender      Extremities: no significant edema and no signs of DVT, TED hose on          Labs:  MRSA screen negative.       Meds:  . Docosahexaenoic Acid  2 capsule Oral Q1200  . docusate sodium  100 mg Oral TID  . ibuprofen  800 mg Oral Q8H  . nitrofurantoin (macrocrystal-monohydrate)  100 mg Oral Q12H  . prenatal multivitamin  1 tablet Oral Q1200    A: [redacted]w[redacted]d with PPROM and PTD Twin A, Twin B still viable, incompetent cervix, s/p cerclage 05/09/15     Stable  P: Continue current plan of care      Upcoming tests/treatments:  [place] scheduled 5/18 for cervical length      MDs will follow  Donnel Saxon CNM, [place] 05/10/2015 10:00 AM

## 2015-05-10 NOTE — Anesthesia Postprocedure Evaluation (Signed)
  Anesthesia Post-op Note  Patient: Megan Turner  Procedure(s) Performed: Procedure(s): CERCLAGE CERVICAL (N/A)  Patient Location: [facility]  Anesthesia Type:Spinal  Level of Consciousness: awake and alert   Airway and Oxygen Therapy: Patient Spontanous Breathing  Post-op Pain: mild  Post-op Assessment: Post-op Vital signs reviewed, Patient's Cardiovascular Status Stable, Respiratory Function Stable, No signs of Nausea or vomiting, Pain level controlled, No headache, No residual numbness and No residual motor weakness  Post-op Vital Signs: Reviewed  Last Vitals:  Filed Vitals:   05/10/15 0606  BP: 126/82  Pulse: 106  Temp: 37.1 C  Resp: 20    Complications: No apparent anesthesia complications

## 2015-05-10 NOTE — Addendum Note (Signed)
Addendum  created 05/10/15 0740 by Garner Nash, CRNA   Modules edited: Notes Section   Notes Section:  File: [id number]

## 2015-05-10 NOTE — Progress Notes (Signed)
Patient ID: Megan Turner, female   DOB: 08/19/1976, 39 y.o.   MRN: [id number] Pt without complaints.  No leakage of fluid or VB.  Good FM  BP 112/63 mmHg  Pulse 85  Temp(Src) 99.1 F (37.3 C) (Oral)  Resp 20  Ht 5\' 4"  (1.626 m)  Wt 227 lb 4 oz (103.08 kg)  BMI 38.99 kg/m2  SpO2 99%  Breastfeeding? No  FHTS Baseline: 157 bpm  Toco none  Pt in NAD CV RRR Lungs CTAB abd  Gravid soft and NT GU no vb EXt no calf tenderness Results for orders placed or performed during the hospital encounter of 04/20/15 (from the past 72 hour(s))  MRSA PCR Screening     Status: None   Collection Time: 05/09/15  2:50 AM  Result Value Ref Range   MRSA by PCR NEGATIVE NEGATIVE    Comment:        The GeneXpert MRSA Assay (FDA approved for NASAL specimens only), is one component of a comprehensive MRSA colonization surveillance program. It is not intended to diagnose MRSA infection nor to guide or monitor treatment for MRSA infections.     Assessment and Plan [redacted]w[redacted]d  Pt stable s/p cerclage. Continue current care

## 2015-05-11 ENCOUNTER — Encounter (HOSPITAL_COMMUNITY): Payer: Self-pay | Admitting: Obstetrics and Gynecology

## 2015-05-11 NOTE — Progress Notes (Signed)
Hospital day # 21 pregnancy at [redacted]w[redacted]d with SVD Twin A on 04/20/15, in situ placenta A  , live Twin B, s/p cerclage od fetal activity S: well      Contractions:none      Vaginal bleeding:none now       Vaginal discharge: no significant change except for more mucous  O: BP 133/79 mmHg  Pulse 87  Temp(Src) 98.6 F (37 C) (Oral)  Resp 18  Ht 5\' 4"  (1.626 m)  Wt 227 lb 4 oz (103.08 kg)  BMI 38.99 kg/m2  SpO2 100%  Breastfeeding? No       Uterus non-tender      Extremities: no significant edema and no signs of DVT  A: [redacted]w[redacted]d with SVD Twin A, in situ placenta A, live Twin B, s/p cerclage     stable  P: U/S for cervical length 05/13/15. If stable, will increase activity with up to chair and shower. If cervix remains stable after increase of activity, may manage as outpatient.  NIOEVO,[id number] A  MD 05/11/2015 3:28 PM

## 2015-05-12 NOTE — Progress Notes (Signed)
I offered follow-up support to Megan Turner who is beginning to grow tired from her long stay here. She continues to have good support and we will continue to follow up with her.  Please page as needs arise.  [employer] Pager, [phone] 3:45 PM    05/12/15 1500  Clinical Encounter Type  Visited With Patient  Visit Type Spiritual support;Follow-up

## 2015-05-12 NOTE — Progress Notes (Signed)
Hospital day # 22 pregnancy at [redacted]w[redacted]d--SVB Twin A 04/20/15, in situ placenta A, Twin B viable, s/p cerclage 05/10/15  S:  Doing well--admits to periodic boredom, but coping with support from husband and friends, staff      Perception of contractions: None      Vaginal bleeding: None       Vaginal discharge:  None--had some mucusy d/c yesterday, none today  O: BP 110/70 mmHg  Pulse 93  Temp(Src) 98 F (36.7 C) (Oral)  Resp 18  Ht 5\' 4"  (1.626 m)  Wt 103.193 kg (227 lb 8 oz)  BMI 39.03 kg/m2  SpO2 99%  Breastfeeding? No      Fetal tracings:  FHR 150s on doppler      Contractions:  None       Uterus non-tender      Extremities: no significant edema and no signs of DVT          Labs:  None       Meds:  . Docosahexaenoic Acid  2 capsule Oral Q1200  . docusate sodium  100 mg Oral TID  . ibuprofen  800 mg Oral Q8H  . prenatal multivitamin  1 tablet Oral Q1200    A: [redacted]w[redacted]d with SVB Twin A 04/20/15, Twin B viable, cerclage 05/10/15     Stable  P: Continue current plan of care      Upcoming tests/treatments:  [place] for cervical length 05/13/15--scheduled with MFM.  If stable, will increase activity, with potential for d/c home over next week.      MDs will follow  Donnel Saxon CNM, [place] 05/12/2015 4:21 PM

## 2015-05-13 ENCOUNTER — Ambulatory Visit (HOSPITAL_COMMUNITY): Payer: BC Managed Care – PPO

## 2015-05-13 NOTE — Progress Notes (Addendum)
Hospital day # 23 pregnancy at [redacted]w[redacted]d-- SVB Twin A 04/20/15, in situ placenta A, Twin B viable, s/p cerclage 05/10/15  S:  Doing well--has felt better this week since cerclage.      Would like to do 17P injections--reports she discussed this with Dr. Lisbeth Renshaw during her [place] today, and "he said it wouldn't hurt".  Patient reports he was going to put that in his note, but I do not see any note to that effect on [place] report.      Perception of contractions: None      Vaginal bleeding: None       Vaginal discharge:  None  O: BP 108/63 mmHg  Pulse 93  Temp(Src) 99 F (37.2 C) (Oral)  Resp 18  Ht 5\' 4"  (1.626 m)  Wt 103.534 kg (228 lb 4 oz)  BMI 39.16 kg/m2  SpO2 100%  Breastfeeding? No      FHR:  154 on last check at 1717      Contractions: None         Uterus non-tender      Extremities: no significant edema and no signs of DVT   US today:  Cervical length 2 cm via translabial scan.                   No funneling.  Normal fluid.          Labs:  NA       Meds:  . Docosahexaenoic Acid  2 capsule Oral Q1200  . docusate sodium  100 mg Oral TID  . ibuprofen  800 mg Oral Q8H  . prenatal multivitamin  1 tablet Oral Q1200    A: [redacted]w[redacted]d with SVB Twin A 04/20/15, in situ placenta A, Twin B viable, s/p cerclage 05/10/15     Stable  P: Continue current plan of care      Upcoming tests/treatments:  Per MFM, repeat US for cervical length in 1-2 weeks      Discussed issue of 17P with patient--per Dr. Alesia Richards, need official note from MFM "recommending" 17P (or at least noting it as a reasonable option).  Will have CNM on call tomorrow consult with Dr. Lisbeth Renshaw regarding issue.      MDs will follow and determine level of increase in activity now s/p stable cervical status.   Donnel Saxon CNM, [place] 05/13/2015 6:01 PM   I saw and examined patient an agree with above findings, assessment and plan.  Dr. Alesia Richards.

## 2015-05-14 NOTE — Progress Notes (Signed)
Ur chart review completed.  

## 2015-05-14 NOTE — Progress Notes (Addendum)
  Hospital day # 24 pregnancy at [redacted]w[redacted]d-- SVB Twin A 04/20/15, in situ placenta A, Twin B viable, s/p cerclage 05/10/15 Subjective:  Patient reports no complaints. No abdominal pain or fevers/chills or vaginal bleeding or loss of fluid.   Objective: I have reviewed patient's vital signs. Filed Vitals:   05/13/15 2135 05/14/15 0558 05/14/15 1228 05/14/15 1706  BP: 126/73 111/62 121/67 101/62  Pulse: 93 60 99 96  Temp: 99.3 F (37.4 C) 98.3 F (36.8 C) 98.2 F (36.8 C) 98.8 F (37.1 C)  TempSrc: Oral Oral Oral Oral  Resp: 16 15 18 18   Height:      Weight:      SpO2: 100% 99%  100%   General: alert, cooperative and no distress GI: soft, non tender, Non distended.  Extremities: warm and well purfused. 1+ edema in lower extremities.    Assessment/Plan:  39 y/o  with SVB Twin A 04/20/15, in situ placenta A, Twin B viable, s/p cerclage 05/10/15  Stable  P: Continue current plan of care  Upcoming tests/treatments: Per MFM, repeat US for cervical length in 1-2 weeks  Discussed issue of 17P with patient need official note from MFM "recommending" 17P (or at least noting it as a reasonable option). CNM to consult with Dr. Lisbeth Renshaw regarding issue. Given stable cervical length, patient agreeable to increase activity level, may shower with chair, get up use chair on bedside.      LOS: 24 days    [facility] 05/14/2015, 6:29 PM

## 2015-05-15 NOTE — Progress Notes (Signed)
Hospital day # 25 pregnancy at [redacted]w[redacted]d--SP di/di twins.  PPROM/SVD of baby at at 16.2, situ placenta.  SP cerclage for baby B 05/10/15.  S:  Perception of contractions: none      Vaginal bleeding: none now       Vaginal discharge:  no significant change  O: BP 107/66 mmHg  Pulse 90  Temp(Src) 97.5 F (36.4 C) (Oral)  Resp 16  Ht 5\' 4"  (1.626 m)  Wt 228 lb 4 oz (103.534 kg)  BMI 39.16 kg/m2  SpO2 100%  Breastfeeding? No      Fetal tracings:155      Contractions:  none       Uterus gravid and non-tender      Extremities: no significant edema and no signs of DVT          Labs:  No results found for this or any previous visit (from the past 24 hour(s)).       Meds:  Current facility-administered medications:  .  acetaminophen (TYLENOL) tablet 650 mg, 650 mg, Oral, Q4H PRN, Happy Begeman, CNM, 650 mg at 05/10/15 0608 .  calcium carbonate (TUMS - dosed in mg elemental calcium) chewable tablet 400 mg of elemental calcium, 2 tablet, Oral, Q4H PRN, Merric Yost, CNM .  dextrose 5 % in lactated ringers infusion, , Intravenous, Continuous, [facility] Givens, MD, Stopped at 05/13/15 1935 .  Docosahexaenoic Acid CAPS 400 mg, 2 capsule, Oral, Q1200, Waymon Amato, MD, 400 mg at 05/15/15 1144 .  docusate sodium (COLACE) capsule 100 mg, 100 mg, Oral, TID, Atreyu Mak, CNM, 100 mg at 05/15/15 0952 .  fentaNYL (SUBLIMAZE) injection 25-50 mcg, 25-50 mcg, Intravenous, Q5 min PRN, Josephine Igo, MD .  Glycerin (Adult) 2.1 G suppository 1 suppository, 1 suppository, Rectal, Daily PRN, Ena Dawley, MD .  ibuprofen (ADVIL,MOTRIN) tablet 600 mg, 600 mg, Oral, Q6H PRN, Amar Keenum, CNM, 600 mg at 05/03/15 1949 .  meperidine (DEMEROL) injection 6.25-12.5 mg, 6.25-12.5 mg, Intravenous, Q5 min PRN, Josephine Igo, MD .  nalbuphine (NUBAIN) injection 10 mg, 10 mg, Intravenous, Q2H PRN, Farrel Gordon, CNM .  oxyCODONE-acetaminophen (PERCOCET/ROXICET) 5-325 MG per tablet 1-2 tablet, 1-2 tablet, Oral, Q6H  PRN, Gavin Pound, CNM, 1 tablet at 05/03/15 2235 .  polyethylene glycol (MIRALAX / GLYCOLAX) packet 17 g, 17 g, Oral, Daily PRN, Ena Dawley, MD, 17 g at 04/30/15 1432 .  prenatal multivitamin tablet 1 tablet, 1 tablet, Oral, Q1200, Vonnie Spagnolo, CNM, 1 tablet at 05/15/15 1144 .  zolpidem (AMBIEN) tablet 5 mg, 5 mg, Oral, QHS PRN, Finnley Lewis, CNM, 5 mg at 04/26/15 0055  A: [redacted]w[redacted]d with SP di/di twins.  PPROM/SVD of baby at at 16.2, situ placenta.  SP cerclage for baby B 05/10/15.     stable     Fetal tracings: reassuring     Contractions: none     Uterus non-tender      Extremities: DTR 1+, no clonus, no edema  P: Continue current plan of care      Upcoming tests/treatments:  Start 17P per MFM      Consult with Dr. Cletis Media  for plan on care      MDs and MFM will follow  Kathryn Cosby, CNM, MSN 05/15/2015. 2:35 PM

## 2015-05-16 MED ORDER — HYDROXYPROGESTERONE CAPROATE 250 MG/ML IM OIL
250.0000 mg | TOPICAL_OIL | INTRAMUSCULAR | Status: DC
  Administered 2015-05-16 – 2015-05-23 (×2): 250 mg via INTRAMUSCULAR
  Filled 2015-05-16 (×2): qty 1

## 2015-05-16 NOTE — Progress Notes (Signed)
Hospital day # 26 pregnancy at [redacted]w[redacted]d-SP di/di twins. PPROM/SVD of baby at at 16.2, situ placenta. SP cerclage for baby B 05/09/15.    S:  Perception of contractions: none      Vaginal bleeding: none now       Vaginal discharge:  no significant change     C/O groan pain and SP HA last night O: BP 110/54 mmHg  Pulse 95  Temp(Src) 99 F (37.2 C) (Oral)  Resp 18  Ht 5\' 4"  (1.626 m)  Wt 229 lb (103.874 kg)  BMI 39.29 kg/m2  SpO2 98%  Breastfeeding? No      Fetal tracings:145, over all reassured      Contractions:  none       Uterus gravid and non-tender      Extremities: no significant edema and no signs of DVT          Labs:  No results found for this or any previous visit (from the past 24 hour(s)).       Meds:  Current facility-administered medications:  .  acetaminophen (TYLENOL) tablet 650 mg, 650 mg, Oral, Q4H PRN, Rosanne Wohlfarth, CNM, 650 mg at 05/15/15 1609 .  calcium carbonate (TUMS - dosed in mg elemental calcium) chewable tablet 400 mg of elemental calcium, 2 tablet, Oral, Q4H PRN, Dalan Cowger, CNM .  dextrose 5 % in lactated ringers infusion, , Intravenous, Continuous, [place] Givens, MD, Stopped at 05/13/15 1935 .  Docosahexaenoic Acid CAPS 400 mg, 2 capsule, Oral, Q1200, Waymon Amato, MD, 400 mg at 05/15/15 1144 .  docusate sodium (COLACE) capsule 100 mg, 100 mg, Oral, TID, Nasiah Lehenbauer, CNM, 100 mg at 05/16/15 0816 .  fentaNYL (SUBLIMAZE) injection 25-50 mcg, 25-50 mcg, Intravenous, Q5 min PRN, Josephine Igo, MD .  Glycerin (Adult) 2.1 G suppository 1 suppository, 1 suppository, Rectal, Daily PRN, Ena Dawley, MD .  hydroxyprogesterone caproate (DELALUTIN) 250 mg/mL injection 250 mg, 250 mg, Intramuscular, Weekly, Shalicia Craghead, CNM, 250 mg at 05/16/15 1021 .  ibuprofen (ADVIL,MOTRIN) tablet 600 mg, 600 mg, Oral, Q6H PRN, Jamai Dolce, CNM, 600 mg at 05/03/15 1949 .  meperidine (DEMEROL) injection 6.25-12.5 mg, 6.25-12.5 mg, Intravenous, Q5 min PRN, Josephine Igo, MD .  nalbuphine (NUBAIN) injection 10 mg, 10 mg, Intravenous, Q2H PRN, Farrel Gordon, CNM .  oxyCODONE-acetaminophen (PERCOCET/ROXICET) 5-325 MG per tablet 1-2 tablet, 1-2 tablet, Oral, Q6H PRN, Gavin Pound, CNM, 1 tablet at 05/15/15 1951 .  polyethylene glycol (MIRALAX / GLYCOLAX) packet 17 g, 17 g, Oral, Daily PRN, Ena Dawley, MD, 17 g at 04/30/15 1432 .  prenatal multivitamin tablet 1 tablet, 1 tablet, Oral, Q1200, Jaidon Ellery, CNM, 1 tablet at 05/15/15 1144 .  zolpidem (AMBIEN) tablet 5 mg, 5 mg, Oral, QHS PRN, Kahlia Lagunes, CNM, 5 mg at 04/26/15 0055  A: [redacted]w[redacted]d with SP di/di twins. PPROM/SVD of baby at at 16.2, situ placenta. SP cerclage for baby B 05/09/15.     stable     Fetal tracings: reassured     Contractions: none     Uterus non-tender      Extremities: DTR 1+, no clonus, no edema    P: Continue current plan of care      Upcoming tests/treatments:  17P today      Increase activity      Repeat cervical length in 1-2 weeks     Possible DC next week if remained stable      MDs will follow    Ariadna Setter, CNM, MSN 05/16/2015.  11:31 AM

## 2015-05-17 NOTE — Progress Notes (Addendum)
Hospital day # 27 pregnancy at 108w1d-SP di/di twins. PPROM/SVD of baby at at 16.2, situ placenta. SP cerclage for baby B 05/09/15.  Subjective: Patient reports no complaints, denies abdominal pain or vaginal bleeding or fevers or chills.  Patient has increased her activity level, showering in a chair, standing up to brush her teeth.      Objective: I have reviewed patient's vital signs. Filed Vitals:   05/16/15 1754 05/16/15 2209 05/17/15 0557 05/17/15 1200  BP: 102/66 99/66 109/70 115/73  Pulse: 92 88 94 99  Temp: 98.9 F (37.2 C) 98.9 F (37.2 C) 98.9 F (37.2 C) 99.3 F (37.4 C)  TempSrc: Oral Oral Oral Oral  Resp: 18 18 15 16   Height:      Weight:      SpO2: 99% 99% 99% 100%    General: alert, cooperative and no distress Bedside fetal heart beat: 154 per RN.   Assessment/Plan: - pregnancy at [redacted]w[redacted]d-SP di/di twins. PPROM/SVD of baby at at 16.2, situ placenta. SP cerclage for baby B 05/09/15.Stable -continue with current care.   LOS: 27 days    [facility] 05/17/2015, 4:07 PM

## 2015-05-18 NOTE — Progress Notes (Signed)
Ur chart review completed.  

## 2015-05-18 NOTE — Progress Notes (Signed)
Patient ID: Megan Turner, female   DOB: [birth year], 39 y.o.   MRN: [id number] Pt without complaints.  No leakage of fluid or VB.  Good FM  BP 109/71 mmHg  Pulse 90  Temp(Src) 98.4 F (36.9 C) (Oral)  Resp 18  Ht 5\' 4"  (1.626 m)  Wt 229 lb (103.874 kg)  BMI 39.29 kg/m2  SpO2 100%  Breastfeeding? No  FHTS Baseline: 156 bpm  Toco none  Pt in NAD CV RRR Lungs CTAB abd  Gravid soft and NT GU no vb EXt no calf tenderness No results found for this or any previous visit (from the past 72 hour(s)).  Assessment and Plan [redacted]w[redacted]d  S/P DELIVERY OF TWIN A WITH RETENTION OF TWIN A PLACENTA AND TWIN B S/P CERCLAGE AND 17P DOING WELL [place] FOR FRIDAY

## 2015-05-19 NOTE — Progress Notes (Addendum)
Hospital day # 29 pregnancy at [redacted]w[redacted]d--PROM Twin A, delivery on 4/25, with Twin B viable, cerclage placed 05/10/15.  Retention of Twin A placenta.  S:  Doing well--has advance activity slightly.  Showering daily in shower chair, standing to brush teeth.  Questions whether she can sit up to eat, when OK for wheelchair ride?      Perception of contractions: None      Vaginal bleeding: None       Vaginal discharge:  None  O: BP 97/54 mmHg  Pulse 94  Temp(Src) 98.5 F (36.9 C) (Oral)  Resp 18  Ht 5\' 4"  (1.626 m)  Wt 103.874 kg (229 lb)  BMI 39.29 kg/m2  SpO2 97%  Breastfeeding? No      FHR:  154 at 0620      Contractions: None        Uterus non-tender      Extremities: no significant edema and no signs of DVT          Labs:  None       Meds:  . Docosahexaenoic Acid  2 capsule Oral Q1200  . docusate sodium  100 mg Oral TID  . hydroxyprogesterone caproate  250 mg Intramuscular Weekly  . prenatal multivitamin  1 tablet Oral Q1200   Anticipating [place] tomorrow for cervical length--last scan 05/13/15.  A: [redacted]w[redacted]d with PROM Twin A, delivery on 4/25, with Twin B viable, cerclage placed 05/10/15.  Retention of Twin A placenta.     Stable  P: Continue current plan of care      Upcoming tests/treatments:  Limited OB US for cervical length tomorrow--ordered.  Planned weekly per patient report. OK for sitting up to eat Will await [place] results tomorrow to determine if stable enough for wheelchair ride.      MDs will follow  Donnel Saxon CNM, [place] 05/19/2015 10:50 AM  If cervix stable on u/s tomorrow, consider managing on bedrest as an outpatient with weekly u/s to check cervix.  Pt doing well except what sounds like round ligament discomfort.

## 2015-05-20 ENCOUNTER — Ambulatory Visit (HOSPITAL_COMMUNITY): Payer: BC Managed Care – PPO

## 2015-05-20 NOTE — Care Management Note (Signed)
Case Management Note  Patient Details  Name: Megan Turner MRN: [id number] Date of Birth: 08-08-1976  Subjective/Objective:                    Action/Plan:   Tub bench for home use     Discharge planning Services  CM Consult  Post Acute Care Choice:  Durable Medical Equipment Choice offered to:  Patient  DME Arranged:  Tub bench DME Agency:  [facility].   Status of Service:   completed   Additional Comments:  Met with patient and offered choice for DME. List provided.  Patient did not have preference so referred to Cherylynn Ridges at [facility]  CM spoke to doctor via phone and she placed order for tub bench for patient to have when discharged to home.  Plan is to have [facility] deliver tub bench to patient's room tomorrow here at [place], [place], [place] 05/20/2015, 5:44 PM

## 2015-05-20 NOTE — Progress Notes (Addendum)
Hospital day # 30 pregnancy at [redacted]w[redacted]d--PROM Twin A, delivery on 4/25, with Twin B viable, cerclage placed 05/10/15. Retention of Twin A placenta.  S:  Doing great--just returned from [place], felt very good with interaction with Dr. Burnett Harry      Perception of contractions: None      Vaginal bleeding: None       Vaginal discharge:  None  O: BP 111/71 mmHg  Pulse 92  Temp(Src) 98.7 F (37.1 C) (Oral)  Resp 18  Ht 5\' 4"  (1.626 m)  Wt 103.08 kg (227 lb 4 oz)  BMI 38.99 kg/m2  SpO2 98%  Breastfeeding? No      FHR 145 at 1118      Contractions:   None      Uterus non-tender      Extremities: no significant edema and no signs of DVT          Labs:  None  [place]:  Cervical length 2.8--"internal and external os somewhat difficult to identify precisely, but endocervical canal measured at least 2.4 cm long.  Cerclage visualized. Compared to images of 5/18, no significant change noted.  Vtx, AF volume subjectively WNL."      Meds:  . Docosahexaenoic Acid  2 capsule Oral Q1200  . docusate sodium  100 mg Oral TID  . hydroxyprogesterone caproate  250 mg Intramuscular Weekly  . prenatal multivitamin  1 tablet Oral Q1200    A: [redacted]w[redacted]d with PROM Twin A, delivery on 4/25, with Twin B viable, cerclage placed 05/10/15. Retention of Twin A placenta.     Stable  P: Continue current plan of care      Upcoming tests/treatments:  Repeat cervical length in 2 weeks.      OK for wheelchair ride outside, since cervix stable.      MDs will follow--may discuss d/c home.    Donnel Saxon CNM, [place] 05/20/2015 1:04 PM   I  Saw and examined patient and agree with above findings, assessment and plan.  I discussed with patient possible discharge to home soon if she remains stable even with increased activity, she is agreable.  Patient to order shower chair for home use. Dr. Alesia Richards.

## 2015-05-21 NOTE — Progress Notes (Signed)
Hospital day # 31 pregnancy at [redacted]w[redacted]d with SVD Twin A on 04/20/15, in situ placenta A  , live Twin B, s/p cerclage  S: well      Contractions:none      Vaginal bleeding:none now       Vaginal discharge: no significant change except for more mucous  O: BP 102/69 mmHg  Pulse 95  Temp(Src) 98.9 F (37.2 C) (Oral)  Resp 16  Ht 5\' 4"  (1.626 m)  Wt 227 lb 4 oz (103.08 kg)  BMI 38.99 kg/m2  SpO2 100%  Breastfeeding? No       Uterus non-tender      Extremities: no significant edema and no signs of DVT   U/S for cervical length 05/20/15: cervix 2.8 cm  A:20+4 weeks with SVD Twin A, in situ placenta A, live Twin B, s/p cerclage     stable  P: Stable. Desires D/C home 05/23/15      Will plan follow-up weekly with U/S for cervical length. Growth every 2-3 weeks starting at 24 weeks   Lynnsie Linders A  MD 05/21/2015 11:18 AM

## 2015-05-22 NOTE — Progress Notes (Addendum)
Hospital day # 32 pregnancy at [redacted]w[redacted]d--SVD Twin A on 04/20/15, in situ placenta A, viable Twin B, s/p cerclage.  S:  Doing well--anticipating d/c tomorrow, has list of questions.  "Feel more settled" about going home.      Perception of contractions: none      Vaginal bleeding: None       Vaginal discharge:  None  O: BP 108/66 mmHg  Pulse 93  Temp(Src) 98.9 F (37.2 C) (Oral)  Resp 19  Ht 5\' 4"  (1.626 m)  Wt 103.08 kg (227 lb 4 oz)  BMI 38.99 kg/m2  SpO2 100%  Breastfeeding? No      FHR:  150 at 0552      Contractions:  None       Uterus gravid and non-tender      Extremities: no significant edema and no signs of DVT          Labs:  None       Meds:  . Docosahexaenoic Acid  2 capsule Oral Q1200  . docusate sodium  100 mg Oral TID  . hydroxyprogesterone caproate  250 mg Intramuscular Weekly  . prenatal multivitamin  1 tablet Oral Q1200    A: [redacted]w[redacted]d with SVD Twin A on 04/20/15, in situ placenta A, viable Twin B, s/p cerclage     Stable  P: Continue current plan of care      Upcoming tests/treatments:  17P tomorrow      Reviewed current plan for d/c tomorrow.  Patient at peace with plan.      Will have utilization review obtain shower chair for patient.      Office to schedule weekly ROB visits with [place] for cervical length.      [place] for growth q 2-3 weeks starting at 24 weeks.      Continue weekly 17P--will address timing and administration at next office visit (? Home administration)       Per Pura Spice at [facility] [phone]), tub bench to be delivered to patient's home by this afternoon by [employer].  Will inform patient.      Support to patient for questions and concerns.      MDs will follow  Donnel Saxon CNM, [place] 05/22/2015 10:24 AM  I saw and examined patient and agree with above findings assessment and plan. Patient complaining of small vaginal discharge especially noted after wiping herself with toilet paper.  Checked patient's peri-pad : minimal white discharge.  On toilet paper some small yellow-white discharge. Discussed with patient likely physiologic. Reviewed rupture of membrane precautions. Dr. Alesia Richards.

## 2015-05-23 DIAGNOSIS — O3430 Maternal care for cervical incompetence, unspecified trimester: Secondary | ICD-10-CM | POA: Diagnosis present

## 2015-05-23 MED ORDER — HYDROXYPROGESTERONE CAPROATE 250 MG/ML IM OIL: 250.0000 mg | TOPICAL_OIL | INTRAMUSCULAR | Status: DC

## 2015-05-23 NOTE — Progress Notes (Signed)
Discharge teaching complete. Pt understood all instructions and did not have any questions. Pt has OB appointment Wednesday, June 1st 2016. Pt pushed via wheelchair out of the hospital and discharged home to family.

## 2015-05-23 NOTE — Discharge Instructions (Signed)
Continue modified bedrest. Pelvic rest Take temperature every day--call for > or = 100. Call for any bleeding, cramping, decreased fetal movement, fever, or any other issues. Keep scheduled appts at [facility]. Continue 17P weekly.  Premature Rupture and Preterm Premature Rupture of Membranes Premature rupture of membranes (PROM) is when the membranes (amniotic sac) break open before contractions or labor starts. Rupture of membranes is commonly referred to as your water breaking. If PROM occurs before 37 weeks of pregnancy, it is called preterm premature rupture of membranes (PPROM). The amniotic sac holds the fetus, keeps infection out, and performs other important functions. Having the amniotic sac rupture before 37 weeks of pregnancy can lead to serious problems and requires immediate attention by your health care provider. CAUSES  PROM near the end of the pregnancy may be caused by natural weakening of the membranes. PPROM is often due to an infection. Other factors that may be associated with PROM include:  Stretching of the amniotic sac because of carrying multiples or having too much amniotic fluid.  Trauma.  Smoking during pregnancy.  Poor nutrition.  Previous preterm birth.  Vaginal bleeding.  Little to no prenatal care.  Problems with the placenta, such as placenta previa or placental abruption. RISKS OF PROM AND PPROM  Delivering a premature baby.  Getting a serious infection of the placental tissues (chorioamnionitis).  Early detachment of the placenta from the uterus (placental abruption).  Compression of the umbilical cord.  Needing a cesarean birth.  Developing a serious infection after delivery. SIGNS OF PROM OR PPROM   A sudden gush or slow leaking of fluid from the vagina.  Constant wet underwear. Sometimes, women mistake the leaking or wetness for urine, especially if the leak is slow and not a gush of fluid. If there is constant leaking or your underwear  continues to get wet, your membranes have likely ruptured. WHAT TO DO IF YOU THINK YOUR MEMBRANES HAVE RUPTURED Call your health care provider right away. You will need to go to the hospital to get checked immediately. WHAT HAPPENS IF YOU ARE DIAGNOSED WITH PROM OR PPROM? Once you arrive at the hospital, you will have tests done. A cervical exam will be performed to check if the cervix has softened or started to open (dilate). If you are diagnosed with PROM, you may be induced within 24 hours if you are not having contractions. If you are diagnosed with PPROM and are not having contractions, you may be induced depending on your trimester.  If you have PPROM, you:  And your baby will be monitored closely for signs of infection or other complications.  May be given an antibiotic medicine to lower the chances of an infection developing.  May be given a steroid medicine to help mature the baby's lungs faster.  May be given a medicine to stop preterm labor.  May be ordered to be on bed rest at home or in the hospital.  May be induced if complications arise for you or the baby. Your treatment will depend on many factors, such as how far along you are, the development of the baby, and other complications that may arise. Document Released: 12/12/2005 Document Revised: 10/02/2013 Document Reviewed: 04/02/2013 [facility] Patient Information 2015 [place], [place]. This information is not intended to replace advice given to you by your health care provider. Make sure you discuss any questions you have with your health care provider.

## 2015-05-23 NOTE — Discharge Summary (Signed)
Physician Discharge Summary  Patient ID: Megan Turner MRN: [id number] DOB/AGE: 1976/12/04 39 y.o.  Admit date: 04/20/2015 Discharge date: 05/23/2015  Admission Diagnoses:  IUP twin gestation at 9 1/7 weeks, PROM Twin A  Discharge Diagnoses:  Principal Problem:   Preterm premature rupture of membranes (PPROM) with unknown onset of labor Active Problems:   Incomplete miscarriage   Elderly multigravida   Twin pregnancy, twins dichorionic and diamniotic   Severe obesity (BMI >= 40)   Cervical polyp   Subchorionic hemorrhage   BV (bacterial vaginosis)   Fibroids, intramural   Fibroids, subserous   Cervical cerclage suture present IUP at 20 6/7 weeks SVD Twin A on 04/20/15, in situ placenta A, viable Twin B, s/p cerclage 05/10/15  Discharged Condition: stable  Hospital Course: Admitted 04/20/15 with Di/di twin IUP at 81 1/7 weeks, with PROM and subsequent delivery of Twin A (non-viable), and retention of Twin A placenta.  Twin B remained viable throughout the patient's hospital stay, with normal fluid volume.  Cervical length was noted to vary on [place] assessment, with cerclage placed on 05/10/15 by Dr. Mancel Bale at patient request.  [place] for anatomy and growth done on 05/06/15, with normal findings and cervical length 1.9.  17P was given weekly on Saturdays, with last dose 05/23/15.  As cevical length stabilized after cerclage placement, patient's level of activity was increased to incorporate sitting in shower, BR trips for BM, brief walking, and wheelchair rides.  Due to stability of cervical length, and no evidence of impending labor, patient was d/c'd home on 05/23/15.  Instructions for modified bedrest and pelvic rest, monitoring of temp and for s/s of labor, weekly f/u at [facility] for cervical length assessment were reviewed.  She will continue weekly 17P.  Consults: MFM  Significant Diagnostic Studies:  Ultrasounds for cervical length: 4/29--appeared funneled, 2.13 04/26/15--1.9,  funneling 04/30/15--3.5 5/11--1.9, with EFW 240 gm, 47%ile, fibroids anterior fundus 4 x 4.6 x 5.9, fundal 4.1 x 5.3 x 4.9 5/18--s/p cerclage, cervical length 2 cm 5/25--2.8 cm, with internal and external os difficult to differentiate, but endocervical canal measured at at least 2.4. Cerclage intact, stable.   Treatments: Bedrest  Discharge Exam: Blood pressure 100/62, pulse 92, temperature 98.6 F (37 C), temperature source Oral, resp. rate 18, height 5\' 4"  (1.626 m), weight 103.307 kg (227 lb 12 oz), SpO2 100 %, not currently breastfeeding. General appearance: alert Resp: clear to auscultation bilaterally Cardio: regular rate and rhythm, S1, S2 normal, no murmur, click, rub or gallop Pelvic: Deferred, no leaking or bleeding Extremities: extremities normal, atraumatic, no cyanosis or edema  Uterus soft, NT, fundal height 21 cm  Disposition: D/c'd home  To continue on modified bedrest Take temp daily--report if > or = 100. Call for any bleeding, leaking, fever, abdominal pain, decreased fetal movement, or any other issues. Keep scheduled appt at [facility] on 6/1--will be followed weekly with ultrasounds for cervical length, with fetal growth to be evaluated q 2-3 weeks.    Medication List    TAKE these medications        acetaminophen 500 MG tablet  Commonly known as:  TYLENOL  Take 1,000 mg by mouth every 6 (six) hours as needed for mild pain or headache.     docusate sodium 100 MG capsule  Commonly known as:  COLACE  Take 100 mg by mouth 2 (two) times daily as needed for mild constipation.     folic acid 355 MCG tablet  Commonly known as:  FOLVITE  Take 400  mcg by mouth daily.     hydroxyprogesterone caproate 250 mg/mL Oil injection  Commonly known as:  DELALUTIN  Inject 1 mL (250 mg total) into the muscle once a week.     prenatal multivitamin Tabs tablet  Take 1 tablet by mouth daily at 12 noon.     simethicone 80 MG chewable tablet  Commonly known as:  MYLICON  Chew  156 mg by mouth every 6 (six) hours as needed for flatulence.           Follow-up Information    Follow up with [facility] On 05/27/2015.   Specialty:  Obstetrics and Gynecology   Why:  Call for any questions or concerns.   Contact information:   [address] [place] [id number] [phone]      Signed: Donnel Saxon 05/23/2015, 9:57 AM

## 2015-06-01 ENCOUNTER — Inpatient Hospital Stay (HOSPITAL_COMMUNITY): Payer: BC Managed Care – PPO

## 2015-06-01 ENCOUNTER — Encounter (HOSPITAL_COMMUNITY): Payer: Self-pay

## 2015-06-01 ENCOUNTER — Inpatient Hospital Stay (HOSPITAL_COMMUNITY)
Admission: AD | Admit: 2015-06-01 | Discharge: 2015-06-12 | DRG: 765 | Disposition: A | Discharge status: Home / Self Care | Payer: BC Managed Care – PPO | Source: Ambulatory Visit | Attending: Obstetrics and Gynecology | Admitting: Obstetrics and Gynecology

## 2015-06-01 DIAGNOSIS — O42919 Preterm premature rupture of membranes, unspecified as to length of time between rupture and onset of labor, unspecified trimester: Secondary | ICD-10-CM

## 2015-06-01 DIAGNOSIS — O09292 Supervision of pregnancy with other poor reproductive or obstetric history, second trimester: Secondary | ICD-10-CM | POA: Diagnosis not present

## 2015-06-01 DIAGNOSIS — O9081 Anemia of the puerperium: Secondary | ICD-10-CM | POA: Diagnosis not present

## 2015-06-01 DIAGNOSIS — O3430 Maternal care for cervical incompetence, unspecified trimester: Secondary | ICD-10-CM

## 2015-06-01 DIAGNOSIS — D649 Anemia, unspecified: Secondary | ICD-10-CM | POA: Diagnosis not present

## 2015-06-01 DIAGNOSIS — O42013 Preterm premature rupture of membranes, onset of labor within 24 hours of rupture, third trimester: Secondary | ICD-10-CM

## 2015-06-01 DIAGNOSIS — O3432 Maternal care for cervical incompetence, second trimester: Secondary | ICD-10-CM | POA: Diagnosis present

## 2015-06-01 DIAGNOSIS — Z87891 Personal history of nicotine dependence: Secondary | ICD-10-CM | POA: Diagnosis not present

## 2015-06-01 DIAGNOSIS — O99214 Obesity complicating childbirth: Secondary | ICD-10-CM | POA: Diagnosis present

## 2015-06-01 DIAGNOSIS — O09812 Supervision of pregnancy resulting from assisted reproductive technology, second trimester: Secondary | ICD-10-CM

## 2015-06-01 DIAGNOSIS — Z6838 Body mass index (BMI) 38.0-38.9, adult: Secondary | ICD-10-CM | POA: Diagnosis not present

## 2015-06-01 DIAGNOSIS — O3412 Maternal care for benign tumor of corpus uteri, second trimester: Secondary | ICD-10-CM | POA: Diagnosis present

## 2015-06-01 DIAGNOSIS — O4100X0 Oligohydramnios, unspecified trimester, not applicable or unspecified: Secondary | ICD-10-CM | POA: Diagnosis present

## 2015-06-01 DIAGNOSIS — Z23 Encounter for immunization: Secondary | ICD-10-CM

## 2015-06-01 DIAGNOSIS — O30042 Twin pregnancy, dichorionic/diamniotic, second trimester: Secondary | ICD-10-CM | POA: Diagnosis present

## 2015-06-01 DIAGNOSIS — O4290 Premature rupture of membranes, unspecified as to length of time between rupture and onset of labor, unspecified weeks of gestation: Secondary | ICD-10-CM | POA: Diagnosis present

## 2015-06-01 DIAGNOSIS — O42912 Preterm premature rupture of membranes, unspecified as to length of time between rupture and onset of labor, second trimester: Secondary | ICD-10-CM | POA: Diagnosis not present

## 2015-06-01 DIAGNOSIS — R109 Unspecified abdominal pain: Secondary | ICD-10-CM

## 2015-06-01 DIAGNOSIS — O321XX2 Maternal care for breech presentation, fetus 2: Secondary | ICD-10-CM | POA: Diagnosis present

## 2015-06-01 DIAGNOSIS — O09522 Supervision of elderly multigravida, second trimester: Secondary | ICD-10-CM

## 2015-06-01 DIAGNOSIS — D252 Subserosal leiomyoma of uterus: Secondary | ICD-10-CM | POA: Diagnosis present

## 2015-06-01 DIAGNOSIS — O322XX2 Maternal care for transverse and oblique lie, fetus 2: Secondary | ICD-10-CM | POA: Diagnosis present

## 2015-06-01 DIAGNOSIS — Z3A22 22 weeks gestation of pregnancy: Secondary | ICD-10-CM | POA: Diagnosis present

## 2015-06-01 DIAGNOSIS — N841 Polyp of cervix uteri: Secondary | ICD-10-CM | POA: Diagnosis present

## 2015-06-01 DIAGNOSIS — O3442 Maternal care for other abnormalities of cervix, second trimester: Secondary | ICD-10-CM | POA: Diagnosis present

## 2015-06-01 DIAGNOSIS — Z98891 History of uterine scar from previous surgery: Secondary | ICD-10-CM

## 2015-06-01 DIAGNOSIS — D251 Intramural leiomyoma of uterus: Secondary | ICD-10-CM | POA: Diagnosis present

## 2015-06-01 DIAGNOSIS — IMO0002 Reserved for concepts with insufficient information to code with codable children: Secondary | ICD-10-CM | POA: Diagnosis present

## 2015-06-01 LAB — URINE MICROSCOPIC-ADD ON

## 2015-06-01 LAB — AMNISURE RUPTURE OF MEMBRANE (ROM) NOT AT ARMC: AMNISURE: POSITIVE

## 2015-06-01 LAB — URINALYSIS, ROUTINE W REFLEX MICROSCOPIC
Bilirubin Urine: NEGATIVE
GLUCOSE, UA: NEGATIVE mg/dL
Ketones, ur: NEGATIVE mg/dL
Nitrite: NEGATIVE
PROTEIN: NEGATIVE mg/dL
Specific Gravity, Urine: 1.025 (ref 1.005–1.030)
Urobilinogen, UA: 0.2 mg/dL (ref 0.0–1.0)
pH: 5.5 (ref 5.0–8.0)

## 2015-06-01 LAB — POCT FERN TEST: POCT FERN TEST: POSITIVE

## 2015-06-01 MED ORDER — PRENATAL MULTIVITAMIN CH
1.0000 | ORAL_TABLET | Freq: Every day | ORAL | Status: DC
  Administered 2015-06-02 – 2015-06-08 (×7): 1 via ORAL
  Filled 2015-06-01 (×7): qty 1

## 2015-06-01 MED ORDER — CALCIUM CARBONATE ANTACID 500 MG PO CHEW
2.0000 | CHEWABLE_TABLET | ORAL | Status: DC | PRN
  Administered 2015-06-02 – 2015-06-08 (×3): 400 mg via ORAL
  Filled 2015-06-01 (×3): qty 2

## 2015-06-01 MED ORDER — AZITHROMYCIN 250 MG PO TABS
500.0000 mg | ORAL_TABLET | Freq: Every day | ORAL | Status: DC
  Filled 2015-06-01: qty 2

## 2015-06-01 MED ORDER — AMOXICILLIN 500 MG PO CAPS
500.0000 mg | ORAL_CAPSULE | Freq: Three times a day (TID) | ORAL | Status: AC
  Administered 2015-06-04 – 2015-06-08 (×15): 500 mg via ORAL
  Filled 2015-06-01 (×16): qty 1

## 2015-06-01 MED ORDER — DEXTROSE 5 % IV SOLN
500.0000 mg | INTRAVENOUS | Status: AC
  Administered 2015-06-02 – 2015-06-03 (×2): 500 mg via INTRAVENOUS
  Filled 2015-06-01 (×2): qty 500

## 2015-06-01 MED ORDER — DOCUSATE SODIUM 100 MG PO CAPS
100.0000 mg | ORAL_CAPSULE | Freq: Every day | ORAL | Status: DC
  Administered 2015-06-02 – 2015-06-04 (×3): 100 mg via ORAL
  Filled 2015-06-01 (×3): qty 1

## 2015-06-01 MED ORDER — ACETAMINOPHEN 325 MG PO TABS
650.0000 mg | ORAL_TABLET | ORAL | Status: DC | PRN
  Administered 2015-06-02 – 2015-06-09 (×3): 650 mg via ORAL
  Filled 2015-06-01 (×4): qty 2

## 2015-06-01 MED ORDER — SODIUM CHLORIDE 0.9 % IV SOLN
2.0000 g | Freq: Four times a day (QID) | INTRAVENOUS | Status: AC
  Administered 2015-06-02 – 2015-06-03 (×8): 2 g via INTRAVENOUS
  Filled 2015-06-01 (×8): qty 2000

## 2015-06-01 MED ORDER — ZOLPIDEM TARTRATE 5 MG PO TABS
5.0000 mg | ORAL_TABLET | Freq: Every evening | ORAL | Status: DC | PRN
  Administered 2015-06-02 – 2015-06-08 (×7): 5 mg via ORAL
  Filled 2015-06-01 (×7): qty 1

## 2015-06-01 NOTE — H&P (Signed)
Megan Turner is a 39 y.o. female, G5P0 at 22.2 weeks SP Di/Di twins SVD with baby A at 16.1 weeks. Cerclage in placept called c/o of abd pain. States she ate something yesterday that upset her stomach. She feel bloated, passing gas and has had a BM last night. The pain return at 3am but not as bad. This morning her abd is tender to touch but no ctx. She denies ctx, vb or lof w/+FM on admission to MAU.  Pt report a large gush in fluid while in [place].      Patient Active Problem List   Diagnosis Date Noted  . Infertility - female 04/20/2015  . Incomplete miscarriage 04/20/2015  . Elderly multigravida 04/20/2015  . Twin pregnancy, twins dichorionic and diamniotic 04/20/2015  . Preterm premature rupture of membranes (PPROM) with unknown onset of labor 04/20/2015  . Severe obesity (BMI >= 40) 04/20/2015  . Cervical polyp 04/20/2015  . Pregnancy in second trimester with history of ectopic pregnancy - pregnancy was achieved through IVF 04/20/2015  . Subchorionic hemorrhage 04/20/2015  . BV (bacterial vaginosis) 04/20/2015  . Fibroids, intramural 04/20/2015  . Fibroids, subserous 04/20/2015  . Determine fetal presentation using ultrasound   . [redacted] weeks gestation of pregnancy   Embryo transfer - IVF pregnancy   History of present pregnancy: Patient entered care at 10.5 weeks, transfer from the [facility].  EDC of 10/04/15 was established by 7.3 wk u/s performed by [facility].  [place] evaluations:  7.3 wks: [facility]: 1.14cm x 1.97 cm x 0.31 cm.  10.6 wks: Twins f/u: Di/Di. +FHTs x 2. Anteverted uterus. Twin A inferior left, normal fluid, Twin B superior right, normal fluid, yolk sac seen. Small [facility] still seen - measuring 1.2 x 0.94 x 2.4cm. Cvx closed, adnexa unremarkable, RTOV - unremarkable, LTOV not seen. 13.5 wks [facility] - Twins: Di/Di twin pregnancy. Twin A - inferior left, Twin B - superior right, subchorionic collection =  1.0cm x 0.55 cm x 1.0 cm, cervix closed. Fibroids - 1) appears right subserosal = 4.7 cm x 3.9 cm x 4.6 cm; 2) posterior intramural = 4.8 cm x 3.1 cm x 3.7 cm; 3) posterior intramural = 3.1 cm x 1.8 cm x 3.2 cm.  Significant prenatal events: Frozen embryo transfer resulting in twin pregnancy via IVF. Bright red bleeding /clots around 10+ wks. Progesterone 50 mg/ml IM oil last taken on 03/10/15. Small [place] since conception. Reported + abdominal tenderness at 11.5 and 13.5 wks. Progesterone level 44.6 on 03/18/15.  Last evaluation: Office on 04/01/15 @ 13.5 wks by Dr. Mancel Bale. +FHTs x2 (154 and 152 respectively). Spec exam performed and revealed ~ 1-2 cm polyp protruding from external os, friable. Cvx closed. Recs per Dr. Mancel Bale = pelvic rest. Bleeding precautions were also reviewed w/ pt.   05/13/15  20.4 weeks - [place] FU -FHT 142, AFI wnl, cervical length 2cm,  05/20/15 21.4 weeks - [place] FU - FHT 155, anterior placenta, cephalic, AFI wnl, cervical lenght 2.8,  06/01/15   22.1 weeks - [place] FU - FHT 165, EFW 1lb 1oz - 50%, AFI wnl, cervical length 2.3cm, cerclage in place, breech,  Filed Vitals:   06/01/15 1942 06/01/15 2251  BP: 143/91 127/71  Pulse: 103 89  Temp: 98.4 F (36.9 C) 98.8 F (37.1 C)  TempSrc:  Oral  Resp: 18 18  Height: 5\' 5"  (1.651 m)   Weight: 233 lb 6 oz (105.858 kg)   SpO2: 99%    OB History  Gravida Para Term Preterm AB TAB SAB Ectopic Multiple Living   4    3 2  1   0    1996 @ 9 wks, TAB; no complications per pt report 1998 @ 9 wks TAB; no complications per pt report 05/14/2014 @ 5 wks, ectopic - pregnancy as result of IVF   Past Medical History  Diagnosis Date  . Infertility   . Ectopic pregnancy 04/2014    associated with fertility treatments    Past Surgical History  Procedure Laterality Date  . Ear lobe surgery      Family History: family history includes Diabetes in her paternal grandmother; Heart disease in her  father; Hypertension in her father and mother; Kidney disease in her father; Lupus in her brother; Ovarian cancer in her paternal grandmother. Social History:  reports that she has quit smoking. Her smoking use included Cigars. She does not have any smokeless tobacco history on file. She reports that she drinks alcohol. She reports that she does not use illicit drugs.Pt is an [place] American female with a post graduate degree and employed as an Microbiologist. She is married to [place] who is very supportive. She is of the [place].    Prenatal Transfer Tool  Maternal Diabetes: Unknown Genetic Screening: Normal - low risk [place] on 03/11/15 Maternal Ultrasounds/Referrals: Small [place] since conception Fetal Ultrasounds or other Referrals: None Maternal Substance Abuse: No Significant Maternal Medications: Meds include: Other: Colace, PNVs, Folic acid Significant Maternal Lab Results: None  TDAP NA Flu NA  ROS: LOF, +FM, -VB, -cramping  No Known Allergies           Chest clear Heart RRR without murmur Abd gravid, NT VE deferred Ext: 1+ edema bilaterally FHR: 160 bpm   Prenatal labs: ABO, Rh: B positive (03/05/15) Antibody: Neg (03/05/15) Rubella: Immune (03/05/15) RPR: NR (03/05/15) HBsAg: Neg (03/05/15) HIV: Neg (03/05/15) Sickle cell/Hgb electrophoresis: SCT Pap: Normal on 11/09/14 GC: Neg (03/11/15) Chlamydia: Neg (03/11/15) Genetic screenings: Low risk [place] Other: Neg NOB urine culture  Hgb 11.3 at NOB   Assessment/Plan: SP Di/Di Twin pregnancy via IVF-embryo transfer IUFD Baby A at 16.1 wks  PPROM Baby B 06/01/15 at 2115 Fern positive, amnisure positive On 17P Afebrile; no concerns for infection at present [place] 160 AMA PPROM Abx protocol Con't doppler IVF with Abx only MFM and NICU consults in the morning

## 2015-06-01 NOTE — MAU Provider Note (Signed)
Megan Turner is a 39 y.o. G5P0 at 22.2 weeks SP Di/Di twins SVD with baby A at 16.1 weeks.  Cerclage in placept called c/o of abd pain.  States she ate something yesterday that upset her stomach.  She feel bloated, passing gas and has had a BM last night.  The pain return at 3am but not as bad.  This morning her abd is tender to touch but no ctx.  She denies ctx, vb or lof w/+FM.       History     Patient Active Problem List   Diagnosis Date Noted  . Cervical cerclage suture present 05/23/2015  . Infertility - female 04/20/2015  . Incomplete miscarriage 04/20/2015  . Elderly multigravida 04/20/2015  . Twin pregnancy, twins dichorionic and diamniotic 04/20/2015  . Preterm premature rupture of membranes (PPROM) with unknown onset of labor 04/20/2015  . Severe obesity (BMI >= 40) 04/20/2015  . Cervical polyp 04/20/2015  . Subchorionic hemorrhage 04/20/2015  . BV (bacterial vaginosis) 04/20/2015  . Fibroids, intramural 04/20/2015  . Fibroids, subserous 04/20/2015    No chief complaint on file.  HPI  OB History    Gravida Para Term Preterm AB TAB SAB Ectopic Multiple Living   5 1   3 2  1  0 0      Obstetric Comments   Pregnancy #4--delivery of 1st twin on 04/20/15.  Twin A placenta still in situ, Twin B still viable.      Past Medical History  Diagnosis Date  . Infertility   . Ectopic pregnancy 04/2014    associated with fertility treatments    Past Surgical History  Procedure Laterality Date  . Ear lobe surgery    . Cervical cerclage N/A 05/09/2015    Procedure: CERCLAGE CERVICAL;  Surgeon: Everett Graff, MD;  Location: [facility];  Service: Gynecology;  Laterality: N/A;    Family History  Problem Relation Age of Onset  . Hypertension Mother   . Heart disease Father   . Kidney disease Father   . Hypertension Father   . Diabetes Paternal Grandmother   . Ovarian cancer Paternal Grandmother   . Lupus Brother     History  Substance Use Topics  . Smoking status:  Former Smoker    Types: Cigars  . Smokeless tobacco: Not on file  . Alcohol Use: 0.0 oz/week     Comment: rare    Allergies: No Known Allergies  Prescriptions prior to admission  Medication Sig Dispense Refill Last Dose  . acetaminophen (TYLENOL) 500 MG tablet Take 1,000 mg by mouth every 6 (six) hours as needed for mild pain or headache.   Past Week at Unknown time  . docusate sodium (COLACE) 100 MG capsule Take 100 mg by mouth 2 (two) times daily as needed for mild constipation.   Past Week at Unknown time  . folic acid (FOLVITE) 366 MCG tablet Take 400 mcg by mouth daily.   Past Week at Unknown time  . hydroxyprogesterone caproate (DELALUTIN) 250 mg/mL OIL injection Inject 1 mL (250 mg total) into the muscle once a week. 0.98 mL    . Prenatal Vit-Fe Fumarate-FA (PRENATAL MULTIVITAMIN) TABS tablet Take 1 tablet by mouth daily at 12 noon.   Past Week at Unknown time  . simethicone (MYLICON) 80 MG chewable tablet Chew 160 mg by mouth every 6 (six) hours as needed for flatulence.   Past Week at Unknown time    ROS See HPI above, all other systems are negative  Physical Exam  Blood pressure 143/91, pulse 103, temperature 98.4 F (36.9 C), resp. rate 18, height 5\' 5"  (1.651 m), weight 233 lb 6 oz (105.858 kg), SpO2 99 %.  Physical Exam Ext:  WNL ABD: Soft, non tender to palpation, no rebound or guarding SVE:   ED Course  Assessment: IUP at  22.1weeks Membranes: intact on baby B FHR:160 CTX:  none IUFD baby A at 16.1 insitu placenta   Plan: Labs: [place] for viability   Megan Turner, CNM, MSN 06/01/2015. 8:04 PM   2130 Preliminary [place] reports - FHT 165, EFW 1lb 1oz - 50%, AFI wnl, cervical length 2.3cm, cerclage in place,Upon return from [place] pt c/o a gush of fluid.  Fern positive, amnisure sent to confirm.   2150 A/P amnisure positive PPROM baby B 2115 Afebrile; no concerns for infection at present AMA  Dr Esaw Grandchild called Pt admitted to [place] MFM and NICU consult for  tomorrow ABX

## 2015-06-01 NOTE — MAU Note (Signed)
Pt reports she has been on bedrest due to preterm labor, was a twin pregnancy and miscarried one twin. Has been having what feels like gas pains since last pm. Was told to come in and be checked.

## 2015-06-02 ENCOUNTER — Encounter (HOSPITAL_COMMUNITY): Payer: Self-pay | Admitting: Obstetrics and Gynecology

## 2015-06-02 ENCOUNTER — Inpatient Hospital Stay (HOSPITAL_COMMUNITY): Payer: BC Managed Care – PPO

## 2015-06-02 DIAGNOSIS — IMO0002 Reserved for concepts with insufficient information to code with codable children: Secondary | ICD-10-CM | POA: Diagnosis present

## 2015-06-02 DIAGNOSIS — O4100X0 Oligohydramnios, unspecified trimester, not applicable or unspecified: Secondary | ICD-10-CM | POA: Diagnosis present

## 2015-06-02 DIAGNOSIS — O4290 Premature rupture of membranes, unspecified as to length of time between rupture and onset of labor, unspecified weeks of gestation: Secondary | ICD-10-CM | POA: Diagnosis present

## 2015-06-02 LAB — CBC
HCT: 31.9 % — ABNORMAL LOW (ref 36.0–46.0)
HEMOGLOBIN: 11.2 g/dL — AB (ref 12.0–15.0)
MCH: 27.8 pg (ref 26.0–34.0)
MCHC: 35.1 g/dL (ref 30.0–36.0)
MCV: 79.2 fL (ref 78.0–100.0)
PLATELETS: 240 10*3/uL (ref 150–400)
RBC: 4.03 MIL/uL (ref 3.87–5.11)
RDW: 13.4 % (ref 11.5–15.5)
WBC: 14.6 10*3/uL — ABNORMAL HIGH (ref 4.0–10.5)

## 2015-06-02 LAB — TYPE AND SCREEN
ABO/RH(D): B POS
Antibody Screen: NEGATIVE

## 2015-06-02 MED ORDER — IBUPROFEN 600 MG PO TABS
600.0000 mg | ORAL_TABLET | Freq: Four times a day (QID) | ORAL | Status: DC | PRN
  Administered 2015-06-02: 600 mg via ORAL
  Filled 2015-06-02: qty 1

## 2015-06-02 MED ORDER — ZOLPIDEM TARTRATE 5 MG PO TABS: 5.0000 mg | ORAL_TABLET | Freq: Every evening | ORAL | Status: DC | PRN

## 2015-06-02 MED ORDER — SALINE SPRAY 0.65 % NA SOLN
1.0000 | NASAL | Status: DC | PRN
  Filled 2015-06-02: qty 44

## 2015-06-02 MED ORDER — SODIUM CHLORIDE 0.9 % IJ SOLN
3.0000 mL | INTRAMUSCULAR | Status: DC | PRN
  Administered 2015-06-02 – 2015-06-03 (×2): 3 mL via INTRAVENOUS
  Filled 2015-06-02: qty 3

## 2015-06-02 MED ORDER — IBUPROFEN 600 MG PO TABS
600.0000 mg | ORAL_TABLET | Freq: Four times a day (QID) | ORAL | Status: DC
  Administered 2015-06-02 – 2015-06-09 (×26): 600 mg via ORAL
  Filled 2015-06-02 (×26): qty 1

## 2015-06-02 NOTE — Progress Notes (Signed)
In to see patient after neo consult with Dr. Higinio Roger. Patient seems more hopeful at present, happy to hear Neo would resuscitate at 23 weeks, and would recommend betamethasone at 22 5/7 weeks.  Patient understands no resuscitation would be undertaken before 23 weeks, due to gross prematurity of fetal lungs. Patient understands if labor occurs, due to significant prematurity and breech presentation, C/S would be required, with possibility of need for vertical C/S incision.  This would require any future deliveries to be by C/S. Currently 22 2/7 weeks by best dating per REI records. Patient had BM this am and was relieved she "didn't push a baby out" during the event.   Resolution of previous crampy upper abdominal pain and bloated feeling.  Will await MFM consult.  Have not ordered betamethasone yet. Would anticipate continuing 17P injections--I spoke with Ulyses Amor, office manager, and she will inform me of plan to get patient's 17P from office to hospital.  Has previously been administered every Thursday.  Donnel Saxon, CNM 06/02/15 12:50P

## 2015-06-02 NOTE — Progress Notes (Addendum)
Called by RN to report patient having small amount bleeding and sensation of pressure. RN reported approx 3 cm spot of bloody-show type bleeding on pad. Bedside US ordered stat. In to see patient--Dr. Rivard in room discussing plan of care with patient and husband. No active bleeding at present--small amount pink spotting on pad. Upper abdominal gaseous distension noted. Patient reports had greens, fruit, and small amount chicken casadilla for lunch.  Per Dr. Cletis Media, will start Ibuprophen 600 mg po q 6 hours around the clock. Will implement BRAT/bland diet.  [place] prelim by tech--baby transverse, cervix appears to have approx 1.22-1.67 cm of closed cervix, with ? Slight funneling--will await formal result from Dr. Burnett Harry.  Will continue close observation.  Donnel Saxon, CNM 06/02/15 4:15p

## 2015-06-02 NOTE — Consult Note (Signed)
Neonatology Consult Note:  At the request of the patients obstetrician Dr. Charlesetta Garibaldi I met with Megan Turner who is 1 2 weeks currently with pregnancy complicated by dichorionic and diamnioticIVF twin gestation with fetal demise of Twin A 04/20/15.  Cerclage placed 05/10/15, now with PPROM of Twin B 6/6. We discussed her current previable state at 22 weeks and then discussed morbidity/mortality at [redacted] weeks gestation.  We discussed delivery room resuscitation, including intubation and surfactant in DR.  Discussed mechanical ventilation and risk for chronic lung disease, risk for IVH with potential for motor / cognitive deficits, ROP, NEC, sepsis, as well as temperature instability and feeding immaturity.  Discussed NG / OG feeds, benefits of MBM in reducing incidence of NEC.   Discussed likely length of stay. She stated that she would like all resuscitative efforts once she reaches [redacted] weeks gestation.  Recommend Betamethasone at 22 5/7 weeks to facilitate optimal resuscitation at 23 weeks.    Thank you for allowing [place] to participate in her care.  Please call with questions.  Higinio Roger, DO  Neonatologist  The total length of face-to-face or floor / unit time for this encounter was 25 minutes.  Counseling and / or coordination of care was greater than fifty percent of the time.

## 2015-06-02 NOTE — Progress Notes (Addendum)
Hospital day # 1 pregnancy at [redacted]w[redacted]d--Di/Di twins, SROM and delivery of Twin A on 04/20/15, SROM Twin B 06/01/15, with in situ placenta Twin A, cerclage 05/10/15.  S:  Sad and discouraged.  Husband at bedside.         Feels need to have BM, but denies any contractions.       Feels "gassy and burpy".      Perception of contractions: None      Vaginal bleeding: None       Vaginal discharge:  watery and no significant change      Questions whether steroids are an option prior to routine timing of 23 6/7 weeks, and whether we would treat PTL in this situation if UCs ensued.  O: BP 99/56 mmHg  Pulse 85  Temp(Src) 97.6 F (36.4 C) (Oral)  Resp 18  Ht 5\' 5"  (1.651 m)  Wt 105.858 kg (233 lb 6 oz)  BMI 38.84 kg/m2  SpO2 99%  Breastfeeding? Unknown      Fetal tracings:  FHR 160      Contractions:   None per toco or per patient      Uterus non-tender      Extremities: no significant edema and no signs of DVT, SCDs on          Labs:   Results for orders placed or performed during the hospital encounter of 06/01/15 (from the past 24 hour(s))  Urinalysis, Routine w reflex microscopic (not at [facility])     Status: Abnormal   Collection Time: 06/01/15  7:50 PM  Result Value Ref Range   Color, Urine YELLOW YELLOW   APPearance HAZY (A) CLEAR   Specific Gravity, Urine 1.025 1.005 - 1.030   pH 5.5 5.0 - 8.0   Glucose, UA NEGATIVE NEGATIVE mg/dL   Hgb urine dipstick SMALL (A) NEGATIVE   Bilirubin Urine NEGATIVE NEGATIVE   Ketones, ur NEGATIVE NEGATIVE mg/dL   Protein, ur NEGATIVE NEGATIVE mg/dL   Urobilinogen, UA 0.2 0.0 - 1.0 mg/dL   Nitrite NEGATIVE NEGATIVE   Leukocytes, UA LARGE (A) NEGATIVE  Urine microscopic-add on     Status: Abnormal   Collection Time: 06/01/15  7:50 PM  Result Value Ref Range   Squamous Epithelial / LPF FEW (A) RARE   WBC, UA TOO NUMEROUS TO COUNT <3 WBC/hpf   RBC / HPF 0-2 <3 RBC/hpf   Bacteria, UA MANY (A) RARE   Urine-Other MUCOUS PRESENT   Fern Test     Status:  Abnormal   Collection Time: 06/01/15  9:31 PM  Result Value Ref Range   POCT Fern Test Positive = ruptured amniotic membanes   Amnisure rupture of membrane (rom)not at [facility]     Status: None   Collection Time: 06/01/15  9:35 PM  Result Value Ref Range   Amnisure ROM POSITIVE   CBC on admission     Status: Abnormal   Collection Time: 06/01/15 11:55 PM  Result Value Ref Range   WBC 14.6 (H) 4.0 - 10.5 K/uL   RBC 4.03 3.87 - 5.11 MIL/uL   Hemoglobin 11.2 (L) 12.0 - 15.0 g/dL   HCT 31.9 (L) 36.0 - 46.0 %   MCV 79.2 78.0 - 100.0 fL   MCH 27.8 26.0 - 34.0 pg   MCHC 35.1 30.0 - 36.0 g/dL   RDW 13.4 11.5 - 15.5 %   Platelets 240 150 - 400 K/uL  Type and screen     Status: None   Collection Time: 06/01/15 11:55  PM  Result Value Ref Range   ABO/RH(D) B POS    Antibody Screen NEG    Sample Expiration 06/04/2015          Meds:  . ampicillin (OMNIPEN) IV  2 g Intravenous Q6H   Followed by  . [START ON 06/04/2015] amoxicillin  500 mg Oral Q8H  . azithromycin  500 mg Intravenous Q24H   Followed by  . [START ON 06/04/2015] azithromycin  500 mg Oral Daily  . docusate sodium  100 mg Oral Daily  . prenatal multivitamin  1 tablet Oral Q1200  Has received 2 of 4 IV Ampicillin doses Has received 1 of 2 IV Zithromax doses  [place] last night:  EFW 1 +1, 487 gm, 50%ile, AF subjectively WNL, largest pocket 5.4, breech, cervix 2.3 cm, cerclage visualized.  A: [redacted]w[redacted]d with SROM/delivery of Twin A 04/20/15, SROM Twin B 06/01/15, cerclage 05/10/15.     Stable  P: Continue current plan of care.      No trendelenberg needed      OK for [facility]      Upcoming tests/treatments:  Continue ATB for latency.      MFM and NICU consult.      I spoke with Dr. Higinio Roger regarding patient status--if patient desires all interventions at 23 weeks, he would support betamethasone prior to that gestation.        Support to patient and husband for concerns.      They understand that if fetus remains breech, and labor ensues, patient  would require C/S, with possibility of classical incision required, leading to need for C/S with future deliveries.      MDs will follow--Dr. Cletis Media will see today.  Donnel Saxon CNM, [place] 06/02/2015 8:20 AM

## 2015-06-02 NOTE — Progress Notes (Signed)
Pt called out to nurses station to report bleeding while up to bathroom. 2-3 cm clot noted on assessment. CNM to be notified to come assess.

## 2015-06-02 NOTE — Progress Notes (Signed)
Ms. Megan Turner was lying in bed and awake when I arrived. Her husband was bedside. She seemed appropriately sad, but not emotional. She said she has to keep reminder herself that He (meaning God) is in control, but the human side of her thinks otherwise. Chaplain offered presence and support as needed. Encouraged pt to have pt called if she needs additional spt. She wanted prayer, at which time her husband joined [place]. Pt was grateful for visit and prayer. Please page if any changes occur and additional spt is needed. Ernest Haber Chaplain   06/02/15 2300  Clinical Encounter Type  Visited With Patient and family together

## 2015-06-03 LAB — GROUP B STREP BY PCR: Group B strep by PCR: NEGATIVE

## 2015-06-03 MED ORDER — VALACYCLOVIR HCL 500 MG PO TABS
500.0000 mg | ORAL_TABLET | Freq: Every day | ORAL | Status: DC
  Administered 2015-06-03 – 2015-06-08 (×6): 500 mg via ORAL
  Filled 2015-06-03 (×7): qty 1

## 2015-06-03 MED ORDER — BETAMETHASONE SOD PHOS & ACET 6 (3-3) MG/ML IJ SUSP
12.0000 mg | Freq: Every day | INTRAMUSCULAR | Status: AC
  Administered 2015-06-05 – 2015-06-06 (×2): 12 mg via INTRAMUSCULAR
  Filled 2015-06-03 (×2): qty 2

## 2015-06-03 NOTE — Progress Notes (Addendum)
Called to bedside at 08:35 PM due to pt passing clots while up to the BR. I personally examined said clots and noted that there was one clot that was more mucosy, pink tinged w/ a spot of red. There was reddish blood to tissue paper. Pt back to bed and only brown mucus noted at her introitus. Pt has this constant feeling of "something coming out." FHR 140s. No contractions on continuous toco and none palpated. None perceived by pt.  I did note that pt's abdomen is distended and she reports being full of gas --abdomen soft otherwise, non-tender. No guarding or rebound tenderness noted. I encouraged pt to take two Tums. She was skeptical due to the way she felt when she took Miralax. I explained that the meds work differently. She agreed to take the medication. BRAT diet ordered.  Reviewed below u/s results w/ Dr. Alesia Richards -- U/S on 06/02/15 ~ 4:17 pm  Final report by Dr. Burnett Harry = severe oligohydramnios (largest pocket = 0). Cervical length = 1.6 cm measured translabially. Transverse presentation.   EFW 1+1 (487 g, 50%tile) on 06/01/15.  Will CTO closely  Farrel Gordon, CNM 06/03/15, 12:15 AM

## 2015-06-03 NOTE — Progress Notes (Signed)
Patient ID: Megan Turner, female   DOB: 01-Feb-1976, 39 y.o.   MRN: [id number]  Megan Turner is a 39 y.o. [id number] at [redacted]w[redacted]d admitted for PPROM of twin B  Subjective: Reports continued leaking.  Denies anymore bleeding overnight.  Denies cramping and reports +FM.  Objective: BP 125/71 mmHg  Pulse 87  Temp(Src) 98.5 F (36.9 C) (Oral)  Resp 99  Ht 5\' 5"  (1.651 m)  Wt 105.416 kg (232 lb 6.4 oz)  BMI 38.67 kg/m2  SpO2 99%  Breastfeeding? 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  I entered room shortly after Dr. Burnett Harry of MFM arrived.  Lengthy discussion had with the pt.  She would like everything done.  Plan is to continue cerclage and discontinue 17P to avoid masking infection per Dr. Burnett Harry.  Additionally, BMZ course will be initiated at 22 5/7wks.  Currently baby is malpresenting so plan vaginal delivery until 23wks.  At that time, will perform c/s if delivery is indicated and baby is still malpresenting.  Pt is 22 3/7 today with EDD of 10/04/15 and she was slightly disturbed to hear that she advances by a  week on Sundays as opposed to Saturdays.  Plan will be to start [place] on Friday.  Will repeat GBS test since last one was done 66mths ago).  Megan Turner Y 06/03/2015, 9:46 AM

## 2015-06-03 NOTE — Progress Notes (Addendum)
When I arrived pt and husband were having dinner. She and her husband were smiling today and were appreciative of my stopping by. Megan Turner said she was glad everything is ok right now and said she would take every minute she could. Megan Turner agreed.  Please page if any Chaplain support is needed. Ernest Haber Chaplain   06/03/15 2000  Clinical Encounter Type  Visited With Patient and family together

## 2015-06-03 NOTE — Progress Notes (Signed)
I spent time with Mahealani and with her mother.  She is feeling frustrated being here again, but she is trying to remain hopeful that her baby will stay in utero longer and that she will settle into being here again.  Her husband is going to be brining her some books and art projects that she enjoys working on and she is setting up to stay for awhile.    It was a short visit today; we will continue to check in on her, but please also page as needs arise.  [place] Pager, [phone] 3:54 PM    06/03/15 1500  Clinical Encounter Type  Visited With Patient;Family  Visit Type Follow-up  Referral From Chaplain  Spiritual Encounters  Spiritual Needs Emotional  Stress Factors  Patient Stress Factors Loss of control

## 2015-06-04 MED ORDER — DOCUSATE SODIUM 100 MG PO CAPS
100.0000 mg | ORAL_CAPSULE | Freq: Two times a day (BID) | ORAL | Status: DC
  Administered 2015-06-04 – 2015-06-08 (×9): 100 mg via ORAL
  Filled 2015-06-04 (×9): qty 1

## 2015-06-04 MED ORDER — SODIUM CHLORIDE 0.9 % IJ SOLN
3.0000 mL | Freq: Two times a day (BID) | INTRAMUSCULAR | Status: DC
  Administered 2015-06-04 – 2015-06-05 (×2): 3 mL via INTRAVENOUS

## 2015-06-04 NOTE — Progress Notes (Addendum)
  Hospital day # 3 pregnancy at [redacted]w[redacted]d--Di/Di twins, SROM and delivery of Twin A on 04/20/15, SROM Twin B 06/01/15, with in situ placenta Twin A, cerclage 05/10/15, Transverse presentation on last ultrasound.    Subjective: Patient seen at bedside at about 10.30 am 06/04/15.  Patient reports she remains hopeful.  Gets occasional  Bilateral mid-lower abdominal pain especially when she lies on the particular side and on pressing down on area.  Using commode.       Objective: I have reviewed patient's vital signs. Filed Vitals:   06/03/15 1537 06/03/15 2016 06/04/15 0821 06/04/15 1153  BP: 115/76  115/66 124/76  Pulse: 86  81 86  Temp: 98.3 F (36.8 C) 97.2 F (36.2 C) 98.2 F (36.8 C) 98.1 F (36.7 C)  TempSrc: Oral Axillary Oral Oral  Resp: 20 20 18 18   Height:      Weight:      SpO2:       General: alert, cooperative and no distress GI: soft, nontender to palpation, gravid.  Extremities: extremities normal, atraumatic, no cyanosis or edema FHT: 142 TOCO: No contractions.   Assessment/Plan:  -pregnancy at [redacted]w[redacted]d--Di/Di twins, SROM and delivery of Twin A on 04/20/15, SROM Twin B 06/01/15, with in situ placenta Twin A, cerclage 05/10/15, stable -continue with latency antibiotics, currently continue amoxicillin, s/p ampicillin and azithromycin -Plan for betamethasone tomorrow as per MFM -continue with current care, fetal heart check Q shift, continuous TOCO.   -SCDs/TED for prophylaxis.    LOS: 3 days    [facility] 06/04/2015, 2:39 PM

## 2015-06-04 NOTE — Consult Note (Signed)
MFM Note  Megan Turner is very well known to MFM service.  OB complications to date:  - Di/Di twinning (ART)  - Twin A: rupture of membranes leading to delivery at 16+ weeks (04/25); retention of Twin B  - Cerclage placement on 05/14 (19 weeks)  - Twin B: SROM at 22 weeks  Currently: without s/s of IUI or maternal infection; continued leaking of AF; oligohydramnios on [place]; fetus ~ 500 grams; S/P neonatology consultation  Plan: 1) Leave cerclage in place for now (risks and benefits discussed - pt does not want it removed) 2) Course of BMZ starting at 22+5 weeks 3) No intervention if delivery required before 23 weeks i.e. vaginal delivery and (probably) no neonatal resuscitation efforts 4) Will most likely need classical C/S for delivery - pt understands 5) PPROM protocol 6) Magnesium sulfate for neuro protection (with delivery) 7) Growth [place] in 2 weeks  Please call with any questions or concerns.  (Face-to-face time with pt: 25 min)

## 2015-06-05 LAB — TYPE AND SCREEN
ABO/RH(D): B POS
Antibody Screen: NEGATIVE

## 2015-06-05 MED ORDER — FLUCONAZOLE 150 MG PO TABS
150.0000 mg | ORAL_TABLET | Freq: Once | ORAL | Status: AC
  Administered 2015-06-05: 150 mg via ORAL
  Filled 2015-06-05: qty 1

## 2015-06-05 NOTE — Progress Notes (Signed)
Hospital day # 4 pregnancy at [redacted]w[redacted]d--Di/Di twins, SROM and delivery of Twin A on 04/20/15 at 16.1, SROM Twin B 06/01/15, with in situ placenta Twin A, cerclage 05/10/15, Transverse presentation on last ultrasound.  Pt in very good spirits today since she received her first dose of BMZ. She is looking forward to making it to Sunday and beyond  S:  Perception of contractions: none      Vaginal bleeding: none now       Vaginal discharge:  no significant change  O: BP 127/79 mmHg  Pulse 91  Temp(Src) 98.7 F (37.1 C) (Oral)  Resp 18  Ht 5\' 5"  (1.651 m)  Wt 232 lb 6.4 oz (105.416 kg)  BMI 38.67 kg/m2  SpO2 99%  Breastfeeding? Unknown      Fetal tracings:150      Contractions:  none       Uterus gravid and non-tender      Extremities: no significant edema and no signs of DVT          Labs:   Results for orders placed or performed during the hospital encounter of 06/01/15 (from the past 24 hour(s))  Type and screen     Status: None   Collection Time: 06/04/15 11:25 PM  Result Value Ref Range   ABO/RH(D) B POS    Antibody Screen NEG    Sample Expiration 06/07/2015          Meds:  Current facility-administered medications:  .  acetaminophen (TYLENOL) tablet 650 mg, 650 mg, Oral, Q4H PRN, Jann Ra, CNM, 650 mg at 06/03/15 1803 .  [COMPLETED] ampicillin (OMNIPEN) 2 g in sodium chloride 0.9 % 50 mL IVPB, 2 g, Intravenous, Q6H, 2 g at 06/03/15 1808 **FOLLOWED BY** amoxicillin (AMOXIL) capsule 500 mg, 500 mg, Oral, Q8H, Keondre Markson, CNM, 500 mg at 06/05/15 1029 .  betamethasone acetate-betamethasone sodium phosphate (CELESTONE) injection 12 mg, 12 mg, Intramuscular, Daily, Everett Graff, MD, 12 mg at 06/05/15 2355 .  calcium carbonate (TUMS - dosed in mg elemental calcium) chewable tablet 400 mg of elemental calcium, 2 tablet, Oral, Q4H PRN, Nikhil Osei, CNM, 400 mg of elemental calcium at 06/04/15 1955 .  docusate sodium (COLACE) capsule 100 mg, 100 mg, Oral, BID, Waymon Amato, MD, 100  mg at 06/05/15 1037 .  ibuprofen (ADVIL,MOTRIN) tablet 600 mg, 600 mg, Oral, Q6H, Donnel Saxon, CNM, 600 mg at 06/05/15 1155 .  prenatal multivitamin tablet 1 tablet, 1 tablet, Oral, Q1200, Oliwia Berzins, CNM, 1 tablet at 06/05/15 1036 .  sodium chloride (OCEAN) 0.65 % nasal spray 1 spray, 1 spray, Each Nare, PRN, Donnel Saxon, CNM .  sodium chloride 0.9 % injection 3 mL, 3 mL, Intravenous, PRN, Crawford Givens, MD, 3 mL at 06/03/15 1232 .  sodium chloride 0.9 % injection 3 mL, 3 mL, Intravenous, Q12H, Donnel Saxon, CNM, 3 mL at 06/05/15 1037 .  valACYclovir (VALTREX) tablet 500 mg, 500 mg, Oral, Daily, Everett Graff, MD, 500 mg at 06/05/15 1036 .  zolpidem (AMBIEN) tablet 5 mg, 5 mg, Oral, QHS PRN, Adan Beal, CNM, 5 mg at 06/04/15 2225  A: [redacted]w[redacted]d withDi/Di twins, SROM and delivery of Twin A on 04/20/15, SROM Twin B 06/01/15, with in situ placenta Twin A, cerclage 05/10/15, Transverse presentation on last ultrasound.      stable     Fetal tracings: 150     Contractions: none     Uterus non-tender     BMZ #1 Today at 0800    Cerclage still  in place   P: Continue current plan of care      Upcoming tests/treatments:  BMZ#2 tomorrow morning      continue with current care, fetal heart check Q shift, continuous TOCO.       SCDs/TED for prophylaxis.       Continue Abx regimen       MDs will follow    Ossie Beltran, CNM, MSN 06/05/2015. 2:46 PM

## 2015-06-05 NOTE — Progress Notes (Signed)
Called by Antenatal RN--patient had noted yellowish d/c and was texting Venus Standard, CNM, with Venus instructing patient to report this to me. Upon viewing, d/c in pad was light yellow and mucusy.  No bleeding, and no d/c since that event.  Advised patient to inform nursing staff of any issues or concerns, and they would inform appropriate providers. Patient agreeable with plan. Reassured as to stable status.  Donnel Saxon, CNM 06/04/15 10:45p

## 2015-06-06 NOTE — Progress Notes (Addendum)
Hospital day # 5 pregnancy at [redacted]w[redacted]d--Di/Di twins, SROM and delivery of Twin A on 04/20/15 at 16.1, SROM Twin B 06/01/15, with in situ placenta Twin A, cerclage 05/10/15, Transverse presentation on last ultrasound.   S: Pt reports positive fetal movement. She reports continued leakage of fluid and describes it as clear. She is hopeful that she will be able to maintain her pregnancy for several more weeks. She reports sharp pain at times with certain movements. This only occurs occasional. She denies vaginal bleeding.     O:  Filed Vitals:   06/06/15 1552  BP: 133/81  Pulse: 89  Temp: 98.1 F (36.7 C)  Resp: 20    Fetal heart tones 155   General NAD normal affect Resp: no respiratory distress  Abdomen gravid nontender  Ext: no edema no evidence of DVT     A: [redacted]w[redacted]d withDi/Di twins, SROM and delivery of Twin A on 04/20/15, SROM Twin B 06/01/15, with in situ placenta Twin A, cerclage 05/10/15, Transverse presentation on last ultrasound.  no signs or symptoms of chorioamnionitis     P: Continue latency antibiotics.   BMZ #2 at 800 today     If delivery is warranted prior to 23 wks plan vaginal delivery .Marland Kitchen If 23 wks or after plan for cesarean section if fetus is still mal-presenting.     Magnesium sulfate for neuro-protection with delivery  Fetal heart tones q shift

## 2015-06-07 LAB — TYPE AND SCREEN
ABO/RH(D): B POS
Antibody Screen: NEGATIVE

## 2015-06-07 MED ORDER — DOCOSAHEXAENOIC ACID 200 MG PO CAPS
2.0000 | ORAL_CAPSULE | Freq: Every day | ORAL | Status: DC
  Administered 2015-06-08: 400 mg via ORAL

## 2015-06-07 NOTE — Progress Notes (Addendum)
Hospital day # 6 pregnancy at [redacted]w[redacted]d--Di/di twins, SROM and delivery Twin A on 04/20/15, SROM Twin B 06/11/15, in situ placenta Twin A, cerclage 05/10/15.  S:  Doing well--very pleased to have reached 23 weeks, and to have received course of betamethasone.      Perception of contractions: None      Vaginal bleeding: None       Vaginal discharge:  No significant change  O: BP 126/81 mmHg  Pulse 83  Temp(Src) 98.4 F (36.9 C) (Oral)  Resp 20  Ht 5\' 5"  (1.651 m)  Wt 105.416 kg (232 lb 6.4 oz)  BMI 38.67 kg/m2  SpO2 99%  Breastfeeding? Unknown      Fetal tracings:  Reassuring on q shift auscultation, on continuous toco      Contractions:   None      Uterus non-tender      Extremities: no significant edema and no signs of DVT          Labs:  T&S q 72 hours, due today       Meds:  . amoxicillin  500 mg Oral Q8H  . docusate sodium  100 mg Oral BID  . ibuprofen  600 mg Oral Q6H  . prenatal multivitamin  1 tablet Oral Q1200  . sodium chloride  3 mL Intravenous Q12H  . valACYclovir  500 mg Oral Daily  On day 4 of 5 of po amoxicillin course.  A: [redacted]w[redacted]d with di/di twins, SROM and delivery Twin A on 04/20/15, SROM Twin B 06/11/15, in situ placenta Twin A, cerclage 05/10/15. S/P completion of betamethasone course yesterday at 0810 On latency antibiotics       Stable  P: Continue current plan of care      Upcoming tests/treatments:  T&S q 72 hours, [place] for growth q 2            weeks (ordered for 06/16/15) If delivery required, plan for cesarean section if fetus is still mal-presenting.  Magnesium sulfate for neuro-protection with delivery        MDs will follow  Donnel Saxon CNM, [place] 06/07/2015 9:45 AM   Patient seen and examined. NO evidence of chorioamnionitis. BMZ complete... Agree with plan as above

## 2015-06-08 MED ORDER — LACTATED RINGERS IV SOLN
INTRAVENOUS | Status: DC
  Administered 2015-06-09 (×5): via INTRAVENOUS

## 2015-06-08 NOTE — Progress Notes (Signed)
Hospital day # 7  Pregnancy at [redacted]w[redacted]d--Di/di twins, SROM and delivery Twin A on 04/20/15, SROM Twin B 06/11/15, in situ placenta Twin A, cerclage 05/10/15.  S:  Doing well this morning. She complains of heartburn. She had contractions last night. The contractions have now resolved.  O:  BP 133/82 mmHg  Pulse 80  Temp(Src) 98.7 F (37.1 C) (Axillary)  Resp 18  Ht 5\' 5"  (1.651 m)  Wt 232 lb 6.4 oz (105.416 kg)  BMI 38.67 kg/m2  SpO2 99%  Breastfeeding? Unknown  Chest: Clear Heart: Regular rate and rhythm Abdomen: Nontender Extremities: No evidence of DVT  Fetal heart tracing: Stable for a [redacted] week gestation. Few contractions.   Labs: T&S q 72 hours, due today   Meds:  . amoxicillin 500 mg Oral Q8H  . docusate sodium 100 mg Oral BID    ibuprofen 600 mg Oral Q6H  . prenatal multivitamin 1 tablet Oral Q1200  . sodium chloride 3 mL Intravenous Q12H  . valACYclovir 500 mg Oral Daily   On day 5 of 5 of po amoxicillin course.  A:   [redacted]w[redacted]d with di/di twins, SROM and delivery Twin A on 04/20/15, SROM Twin B 06/11/15, in situ placenta Twin A, cerclage 05/10/15. S/P completion of betamethasone course yesterday at 0810 On latency antibiotics  Stable  P:   Continue current plan of care  Upcoming tests/treatments: T&S q 72 hours, [place] for growth q 2 weeks (ordered for 06/16/15) If delivery required, plan for cesarean section if fetus is still mal-presenting.  Magnesium sulfate for neuro-protection with delivery      Gildardo Cranker, M.D. 06/08/2015 9:13 AM

## 2015-06-08 NOTE — Progress Notes (Addendum)
TC from pt's primary care nurse re: [facility]. Strip reviewed - variables noted, FHR 170s.   P: Will proceed w/ prolonged monitoring. Consult Dr. Raphael Gibney.   Farrel Gordon, CNM 06/08/15, 10:58 PM

## 2015-06-08 NOTE — Progress Notes (Signed)
Monitor strip reviewed. I do not think that it is appropriate to proceed with delivery at this gestational age. I will give a fluid bolus and continue to monitor.  Dr. Raphael Gibney

## 2015-06-08 NOTE — Progress Notes (Addendum)
Initial Nutrition Assessment  DOCUMENTATION CODES:  Obesity unspecified  INTERVENTION:  Snacks, Regular diet  NUTRITION DIAGNOSIS:  Increased nutrient needs related to  (pregnancy and fetal growth requirements) as evidenced by  ([redacted] weeks gestation).  GOAL:  Patient will meet greater than or equal to 90% of their needs, Weight gain   MONITOR:  Weight trends  REASON FOR ASSESSMENT: (antenatal)    ASSESSMENT: 23 1/7 weeks, SROM, cerclage. Pre-pregnancy weight 230 lbs, weight gain total: 2 lbs. Weight gain goals 11-20 lbs. Pre-pregnancy BMI 37.9. Appetite good, diet tolerated well. Provided with retail and snack menus  Height:  Ht Readings from Last 1 Encounters:  06/01/15 5\' 5"  (1.651 m)    Weight:  Wt Readings from Last 1 Encounters:  06/03/15 232 lb 6.4 oz (105.416 kg)    Ideal Body Weight:    125 lbs  Wt Readings from Last 10 Encounters:  06/03/15 232 lb 6.4 oz (105.416 kg)  05/23/15 227 lb 12 oz (103.307 kg)  07/24/14 240 lb (108.863 kg)  07/12/13 205 lb (92.987 kg)  03/21/12 198 lb (89.812 kg)    BMI:  Body mass index is 38.67 kg/(m^2).  Estimated Nutritional Needs:  Kcal:  2100-2300  Protein:  90-100 g  Fluid:  2.4 L  Diet Order:  Diet regular Room service appropriate?: Yes; Fluid consistency:: Thin  EDUCATION NEEDS:  No education needs identified at this time  No intake or output data in the 24 hours ending 06/08/15 [place].Fredderick Severance LDN Neonatal Nutrition Support Specialist/RD III Pager [phone]      Phone [phone]

## 2015-06-09 ENCOUNTER — Inpatient Hospital Stay (HOSPITAL_COMMUNITY): Payer: BC Managed Care – PPO | Admitting: Anesthesiology

## 2015-06-09 ENCOUNTER — Encounter (HOSPITAL_COMMUNITY): Payer: Self-pay | Admitting: Anesthesiology

## 2015-06-09 ENCOUNTER — Inpatient Hospital Stay (HOSPITAL_COMMUNITY): Payer: BC Managed Care – PPO

## 2015-06-09 ENCOUNTER — Encounter (HOSPITAL_COMMUNITY)
Admission: AD | Disposition: A | Discharge status: Home / Self Care | Payer: Self-pay | Source: Ambulatory Visit | Attending: Obstetrics and Gynecology

## 2015-06-09 LAB — CBC
HCT: 32 % — ABNORMAL LOW (ref 36.0–46.0)
HEMOGLOBIN: 11 g/dL — AB (ref 12.0–15.0)
MCH: 27.3 pg (ref 26.0–34.0)
MCHC: 34.4 g/dL (ref 30.0–36.0)
MCV: 79.4 fL (ref 78.0–100.0)
Platelets: 221 10*3/uL (ref 150–400)
RBC: 4.03 MIL/uL (ref 3.87–5.11)
RDW: 13.5 % (ref 11.5–15.5)
WBC: 16 10*3/uL — ABNORMAL HIGH (ref 4.0–10.5)

## 2015-06-09 SURGERY — Surgical Case
Anesthesia: Regional

## 2015-06-09 MED ORDER — MENTHOL 3 MG MT LOZG
1.0000 | LOZENGE | OROMUCOSAL | Status: DC | PRN
Start: 2015-06-09 — End: 2015-06-12

## 2015-06-09 MED ORDER — FENTANYL CITRATE (PF) 100 MCG/2ML IJ SOLN
INTRAMUSCULAR | Status: AC
  Filled 2015-06-09: qty 2

## 2015-06-09 MED ORDER — KETOROLAC TROMETHAMINE 30 MG/ML IJ SOLN: 30.0000 mg | Freq: Four times a day (QID) | INTRAMUSCULAR | Status: DC | PRN

## 2015-06-09 MED ORDER — OXYTOCIN 10 UNIT/ML IJ SOLN
40.0000 [IU] | INTRAVENOUS | Status: DC | PRN
  Administered 2015-06-09: 40 [IU] via INTRAVENOUS

## 2015-06-09 MED ORDER — ONDANSETRON HCL 4 MG/2ML IJ SOLN
INTRAMUSCULAR | Status: DC | PRN
  Administered 2015-06-09: 4 mg via INTRAVENOUS

## 2015-06-09 MED ORDER — OXYTOCIN 10 UNIT/ML IJ SOLN
INTRAMUSCULAR | Status: AC
  Filled 2015-06-09: qty 4

## 2015-06-09 MED ORDER — NALOXONE HCL 1 MG/ML IJ SOLN: 1.0000 ug/kg/h | INTRAVENOUS | Status: DC | PRN

## 2015-06-09 MED ORDER — DIPHENHYDRAMINE HCL 25 MG PO CAPS: 25.0000 mg | ORAL_CAPSULE | Freq: Four times a day (QID) | ORAL | Status: DC | PRN

## 2015-06-09 MED ORDER — ONDANSETRON HCL 4 MG/2ML IJ SOLN
INTRAMUSCULAR | Status: AC
  Filled 2015-06-09: qty 2

## 2015-06-09 MED ORDER — MORPHINE SULFATE (PF) 0.5 MG/ML IJ SOLN
INTRAMUSCULAR | Status: DC | PRN
  Administered 2015-06-09: .2 mg via EPIDURAL

## 2015-06-09 MED ORDER — LANOLIN HYDROUS EX OINT: 1.0000 "application " | TOPICAL_OINTMENT | CUTANEOUS | Status: DC | PRN

## 2015-06-09 MED ORDER — ZOLPIDEM TARTRATE 5 MG PO TABS: 5.0000 mg | ORAL_TABLET | Freq: Every evening | ORAL | Status: DC | PRN

## 2015-06-09 MED ORDER — DIPHENHYDRAMINE HCL 50 MG/ML IJ SOLN: 12.5000 mg | INTRAMUSCULAR | Status: DC | PRN

## 2015-06-09 MED ORDER — METHYLERGONOVINE MALEATE 0.2 MG/ML IJ SOLN: 0.2000 mg | INTRAMUSCULAR | Status: DC | PRN

## 2015-06-09 MED ORDER — ACETAMINOPHEN 325 MG PO TABS: 650.0000 mg | ORAL_TABLET | ORAL | Status: DC | PRN

## 2015-06-09 MED ORDER — NALBUPHINE HCL 10 MG/ML IJ SOLN: 5.0000 mg | Freq: Once | INTRAMUSCULAR | Status: DC | PRN

## 2015-06-09 MED ORDER — SIMETHICONE 80 MG PO CHEW
80.0000 mg | CHEWABLE_TABLET | Freq: Three times a day (TID) | ORAL | Status: DC
  Administered 2015-06-10 – 2015-06-12 (×8): 80 mg via ORAL
  Filled 2015-06-09 (×8): qty 1

## 2015-06-09 MED ORDER — ONDANSETRON HCL 4 MG/2ML IJ SOLN: 4.0000 mg | Freq: Three times a day (TID) | INTRAMUSCULAR | Status: DC | PRN

## 2015-06-09 MED ORDER — SIMETHICONE 80 MG PO CHEW: 80.0000 mg | CHEWABLE_TABLET | ORAL | Status: DC | PRN

## 2015-06-09 MED ORDER — SIMETHICONE 80 MG PO CHEW
80.0000 mg | CHEWABLE_TABLET | ORAL | Status: DC
  Administered 2015-06-09 – 2015-06-11 (×3): 80 mg via ORAL
  Filled 2015-06-09 (×3): qty 1

## 2015-06-09 MED ORDER — SODIUM CHLORIDE 0.9 % IJ SOLN: 3.0000 mL | INTRAMUSCULAR | Status: DC | PRN

## 2015-06-09 MED ORDER — MAGNESIUM SULFATE 50 % IJ SOLN
2.0000 g/h | INTRAVENOUS | Status: DC
  Filled 2015-06-09: qty 80

## 2015-06-09 MED ORDER — METHYLERGONOVINE MALEATE 0.2 MG PO TABS: 0.2000 mg | ORAL_TABLET | ORAL | Status: DC | PRN

## 2015-06-09 MED ORDER — VASOPRESSIN 20 UNIT/ML IJ SOLN
INTRAMUSCULAR | Status: DC | PRN
  Administered 2015-06-09: 10 [IU] via INTRAMUSCULAR

## 2015-06-09 MED ORDER — FENTANYL CITRATE (PF) 100 MCG/2ML IJ SOLN
100.0000 ug | Freq: Once | INTRAMUSCULAR | Status: AC
  Administered 2015-06-09: 100 ug via INTRAVENOUS
  Filled 2015-06-09: qty 2

## 2015-06-09 MED ORDER — MORPHINE SULFATE 0.5 MG/ML IJ SOLN
INTRAMUSCULAR | Status: AC
  Filled 2015-06-09: qty 10

## 2015-06-09 MED ORDER — KETOROLAC TROMETHAMINE 30 MG/ML IJ SOLN
INTRAMUSCULAR | Status: AC
  Filled 2015-06-09: qty 1

## 2015-06-09 MED ORDER — NITROGLYCERIN IN D5W 200-5 MCG/ML-% IV SOLN
INTRAVENOUS | Status: AC
Start: 2015-06-09 — End: 2015-06-09
  Filled 2015-06-09: qty 250

## 2015-06-09 MED ORDER — MEPERIDINE HCL 25 MG/ML IJ SOLN
INTRAMUSCULAR | Status: AC
  Filled 2015-06-09: qty 1

## 2015-06-09 MED ORDER — CEFAZOLIN SODIUM-DEXTROSE 2-3 GM-% IV SOLR
INTRAVENOUS | Status: DC | PRN
  Administered 2015-06-09: 2 g via INTRAVENOUS

## 2015-06-09 MED ORDER — TETANUS-DIPHTH-ACELL PERTUSSIS 5-2.5-18.5 LF-MCG/0.5 IM SUSP
0.5000 mL | Freq: Once | INTRAMUSCULAR | Status: AC
  Administered 2015-06-10: 0.5 mL via INTRAMUSCULAR
  Filled 2015-06-09: qty 0.5

## 2015-06-09 MED ORDER — PROPOFOL 10 MG/ML IV BOLUS
INTRAVENOUS | Status: AC
  Filled 2015-06-09: qty 20

## 2015-06-09 MED ORDER — PHENYLEPHRINE 8 MG IN D5W 100 ML (0.08MG/ML) PREMIX OPTIME
INJECTION | INTRAVENOUS | Status: AC
  Filled 2015-06-09: qty 100

## 2015-06-09 MED ORDER — FENTANYL CITRATE (PF) 100 MCG/2ML IJ SOLN
25.0000 ug | INTRAMUSCULAR | Status: DC | PRN
  Administered 2015-06-09 (×2): 50 ug via INTRAVENOUS

## 2015-06-09 MED ORDER — BUPIVACAINE HCL (PF) 0.25 % IJ SOLN
INTRAMUSCULAR | Status: AC
  Filled 2015-06-09: qty 30

## 2015-06-09 MED ORDER — ONDANSETRON HCL 4 MG/2ML IJ SOLN: 4.0000 mg | Freq: Once | INTRAMUSCULAR | Status: DC | PRN

## 2015-06-09 MED ORDER — SCOPOLAMINE 1 MG/3DAYS TD PT72: 1.0000 | MEDICATED_PATCH | Freq: Once | TRANSDERMAL | Status: DC

## 2015-06-09 MED ORDER — BUPIVACAINE HCL (PF) 0.25 % IJ SOLN
INTRAMUSCULAR | Status: DC | PRN
  Administered 2015-06-09: 20 mL

## 2015-06-09 MED ORDER — FERROUS SULFATE 325 (65 FE) MG PO TABS
325.0000 mg | ORAL_TABLET | Freq: Two times a day (BID) | ORAL | Status: DC
  Administered 2015-06-10 – 2015-06-12 (×5): 325 mg via ORAL
  Filled 2015-06-09 (×5): qty 1

## 2015-06-09 MED ORDER — DIPHENHYDRAMINE HCL 25 MG PO CAPS: 25.0000 mg | ORAL_CAPSULE | ORAL | Status: DC | PRN

## 2015-06-09 MED ORDER — NIFEDIPINE 10 MG PO CAPS
10.0000 mg | ORAL_CAPSULE | Freq: Three times a day (TID) | ORAL | Status: DC
  Administered 2015-06-09: 10 mg via ORAL
  Filled 2015-06-09 (×4): qty 1

## 2015-06-09 MED ORDER — MAGNESIUM SULFATE BOLUS VIA INFUSION
4.0000 g | Freq: Once | INTRAVENOUS | Status: AC
  Administered 2015-06-09: 4 g via INTRAVENOUS
  Filled 2015-06-09: qty 500

## 2015-06-09 MED ORDER — NALBUPHINE HCL 10 MG/ML IJ SOLN: 5.0000 mg | INTRAMUSCULAR | Status: DC | PRN

## 2015-06-09 MED ORDER — MEASLES, MUMPS & RUBELLA VAC ~~LOC~~ INJ: 0.5000 mL | INJECTION | Freq: Once | SUBCUTANEOUS | Status: DC

## 2015-06-09 MED ORDER — PRENATAL MULTIVITAMIN CH
1.0000 | ORAL_TABLET | Freq: Every day | ORAL | Status: DC
  Administered 2015-06-10 – 2015-06-12 (×3): 1 via ORAL
  Filled 2015-06-09 (×3): qty 1

## 2015-06-09 MED ORDER — MEPERIDINE HCL 25 MG/ML IJ SOLN
INTRAMUSCULAR | Status: DC | PRN
  Administered 2015-06-09: 25 mg via INTRAVENOUS

## 2015-06-09 MED ORDER — MEPERIDINE HCL 25 MG/ML IJ SOLN
6.2500 mg | INTRAMUSCULAR | Status: DC | PRN
Start: 2015-06-09 — End: 2015-06-09

## 2015-06-09 MED ORDER — OXYCODONE-ACETAMINOPHEN 5-325 MG PO TABS
1.0000 | ORAL_TABLET | ORAL | Status: DC | PRN
  Administered 2015-06-10 (×2): 1 via ORAL
  Filled 2015-06-09 (×4): qty 1

## 2015-06-09 MED ORDER — VASOPRESSIN 20 UNIT/ML IV SOLN
INTRAVENOUS | Status: AC
  Filled 2015-06-09: qty 1

## 2015-06-09 MED ORDER — IBUPROFEN 600 MG PO TABS
600.0000 mg | ORAL_TABLET | Freq: Four times a day (QID) | ORAL | Status: DC
  Administered 2015-06-09 – 2015-06-12 (×11): 600 mg via ORAL
  Filled 2015-06-09 (×11): qty 1

## 2015-06-09 MED ORDER — NALOXONE HCL 0.4 MG/ML IJ SOLN: 0.4000 mg | INTRAMUSCULAR | Status: DC | PRN

## 2015-06-09 MED ORDER — OXYCODONE-ACETAMINOPHEN 5-325 MG PO TABS
2.0000 | ORAL_TABLET | ORAL | Status: DC | PRN
  Administered 2015-06-10 – 2015-06-12 (×10): 2 via ORAL
  Filled 2015-06-09 (×9): qty 2

## 2015-06-09 MED ORDER — WITCH HAZEL-GLYCERIN EX PADS: 1.0000 "application " | MEDICATED_PAD | CUTANEOUS | Status: DC | PRN

## 2015-06-09 MED ORDER — SODIUM CHLORIDE 0.9 % IJ SOLN
INTRAMUSCULAR | Status: AC
  Filled 2015-06-09: qty 150

## 2015-06-09 MED ORDER — DIBUCAINE 1 % RE OINT: 1.0000 "application " | TOPICAL_OINTMENT | RECTAL | Status: DC | PRN

## 2015-06-09 MED ORDER — BUPIVACAINE IN DEXTROSE 0.75-8.25 % IT SOLN
INTRATHECAL | Status: DC | PRN
  Administered 2015-06-09: 1.8 mL via INTRATHECAL

## 2015-06-09 MED ORDER — KETOROLAC TROMETHAMINE 30 MG/ML IJ SOLN
30.0000 mg | Freq: Four times a day (QID) | INTRAMUSCULAR | Status: DC | PRN
  Administered 2015-06-09: 30 mg via INTRAMUSCULAR

## 2015-06-09 MED ORDER — SENNOSIDES-DOCUSATE SODIUM 8.6-50 MG PO TABS
2.0000 | ORAL_TABLET | ORAL | Status: DC
  Administered 2015-06-09 – 2015-06-11 (×3): 2 via ORAL
  Filled 2015-06-09 (×3): qty 2

## 2015-06-09 MED ORDER — LACTATED RINGERS IV SOLN
INTRAVENOUS | Status: DC
  Administered 2015-06-09: 20:00:00 via INTRAVENOUS

## 2015-06-09 SURGICAL SUPPLY — 34 items
BENZOIN TINCTURE PRP APPL 2/3 (GAUZE/BANDAGES/DRESSINGS) ×2 IMPLANT
BOOTIES KNEE HIGH SLOAN (MISCELLANEOUS) ×4 IMPLANT
CLAMP CORD UMBIL (MISCELLANEOUS) IMPLANT
CLOTH BEACON ORANGE TIMEOUT ST (SAFETY) ×2 IMPLANT
DRAIN JACKSON PRT FLT 10 (DRAIN) IMPLANT
DRAPE SHEET LG 3/4 BI-LAMINATE (DRAPES) IMPLANT
DRSG OPSITE POSTOP 4X10 (GAUZE/BANDAGES/DRESSINGS) ×2 IMPLANT
DURAPREP 26ML APPLICATOR (WOUND CARE) ×2 IMPLANT
ELECT REM PT RETURN 9FT ADLT (ELECTROSURGICAL) ×2
ELECTRODE REM PT RTRN 9FT ADLT (ELECTROSURGICAL) ×1 IMPLANT
EVACUATOR SILICONE 100CC (DRAIN) IMPLANT
EXTRACTOR VACUUM M CUP 4 TUBE (SUCTIONS) IMPLANT
GLOVE BIOGEL PI IND STRL 7.0 (GLOVE) ×1 IMPLANT
GLOVE BIOGEL PI INDICATOR 7.0 (GLOVE) ×1
GLOVE ECLIPSE 6.5 STRL STRAW (GLOVE) ×2 IMPLANT
GOWN STRL REUS W/TWL LRG LVL3 (GOWN DISPOSABLE) ×4 IMPLANT
KIT ABG SYR 3ML LUER SLIP (SYRINGE) IMPLANT
NEEDLE HYPO 22GX1.5 SAFETY (NEEDLE) ×2 IMPLANT
NEEDLE HYPO 25X5/8 SAFETYGLIDE (NEEDLE) IMPLANT
NS IRRIG 1000ML POUR BTL (IV SOLUTION) ×4 IMPLANT
PACK C SECTION WH (CUSTOM PROCEDURE TRAY) ×2 IMPLANT
PAD OB MATERNITY 4.3X12.25 (PERSONAL CARE ITEMS) ×2 IMPLANT
RTRCTR C-SECT PINK 25CM LRG (MISCELLANEOUS) ×2 IMPLANT
STRIP CLOSURE SKIN 1/2X4 (GAUZE/BANDAGES/DRESSINGS) ×2 IMPLANT
SUT MNCRL AB 3-0 PS2 27 (SUTURE) ×2 IMPLANT
SUT SILK 2 0 FSL 18 (SUTURE) IMPLANT
SUT VIC AB 0 CTX 36 (SUTURE) ×2
SUT VIC AB 0 CTX36XBRD ANBCTRL (SUTURE) ×2 IMPLANT
SUT VIC AB 1 CT1 36 (SUTURE) ×4 IMPLANT
SUT VIC AB 2-0 CT1 27 (SUTURE)
SUT VIC AB 2-0 CT1 TAPERPNT 27 (SUTURE) IMPLANT
SYR 20CC LL (SYRINGE) ×2 IMPLANT
TOWEL OR 17X24 6PK STRL BLUE (TOWEL DISPOSABLE) ×2 IMPLANT
TRAY FOLEY CATH SILVER 14FR (SET/KITS/TRAYS/PACK) ×2 IMPLANT

## 2015-06-09 NOTE — Op Note (Addendum)
Preoperative diagnosis: Intrauterine pregnancy at 23 weeks and 2 days, s/p twin A delivery 04/20/15, placenta A in situ, PPROM twin B 06/01/15, s/p cerclage, preterm labor of twin B, frank breech  Post operative diagnosis: Same, uterine fibroids  Anesthesia: Spinal  Anesthesiologist: Dr. Lauretta Grill  Procedure: Primary low transverse cesarean section with vertical extension ( inverted T), myomectomy and cerclage removal  Surgeon: Dr. Katharine Look Alinah Sheard  Assistant: Gavin Pound CNM  Estimated blood loss: 350 cc  Procedure:  After being informed of the planned procedure and possible complications including bleeding, infection, injury to other organs,risk of classical incision requiring future cesarean deliveries, informed consent is obtained. The patient is taken to OR #1 and given spinal anesthesia without complication. She is placed in the dorsal decubitus position with the pelvis tilted to the left. She is then prepped and draped in a sterile fashion. A Foley catheter is inserted in her bladder.  After assessing adequate level of anesthesia, we infiltrate the suprapubic area with 20 cc of Marcaine 0.25 and perform a Pfannenstiel incision which is brought down sharply to the fascia. The fascia is entered in a low transverse fashion. Linea alba is dissected. Peritoneum is entered in a midline fashion. An Alexis retractor is easily positioned.   The myometrium is then entered in a low transverse fashion, 2 cm above the vesico-uterine junction ; first with knife and then extended bluntly. Amniotic fluid is inexistant. We easily deliver the body of the baby but are unable to deliver the head. We extend the incision vertically up to the fundus in order to deliver the fetal head.We complete  the birth of a female  infant in frank breech presentation. The cord is clamped and sectioned. The baby is given to the neonatologist present in the room.  3 cc of blood is drawn from the umbilical artery for cor gas.10  cc of blood is drawn from the umbilical vein.The placenta is allowed to deliver spontaneously. It is complete and the cord has 3 vessels. Uterine revision is negative.We are unable to identify placenta/cord A.  We proceed with closure of the vertical incision on the myometrium in 3 layer of running locked suture of 0 Vicryl, then with a baseball suture of 2-0 Vicryl closing the serosa.    We then close the transverse incision in 2 layers: First with a running locked suture of 0 Vicryl, then with a Lembert suture of 0 Vicryl imbricating the first one. Hemostasis is completed with cauterization on peritoneal edges.  Hemostasis is completed with cauterization on peritoneal edges.  Both paracolic gutters are cleaned. Both tubes and ovaries are assessed and normal. The pelvis is profusely irrigated with warm saline to confirm a satisfactory hemostasis.  We note 5 fibroids: 3 < 1 cm subserosal fibroid at the right cornua, 1 6 cm intramural fibroid at the right fundus and one 5 cm pedunculated fibroid anterior fundal. We decide to remove the pedunculated fibroid which is resected after infiltrating its base with 10 cc of vasopressin 10 units:50 cc saline. A pursetring suture of 0 vicryl and a baseball suture of 0 vicryl achieve good hemostasis.  Retractors and sponges are removed. Under fascia hemostasis is completed with cauterization. The fascia is then closed with 2 running sutures of 0 Vicryl meeting midline. The wound is irrigated with warm saline and hemostasis is completed with cauterization. The skin is closed with a subcuticular suture of 3-0 Monocryl and Steri-Strips.  Instrument and sponge count is complete x2. Estimated blood loss is  350 cc.  The procedure is well tolerated by the patient who is taken to recovery room in a well and stable condition.  female baby  was born at 12:27 and received an Apgar of 2  at 1 minute, 4 at 5 minutes and 8 at 10 minutes. Arterial cord pH=  7.296   Specimen: Placenta and fibroid  sent to pathology.   Jacqualynn Parco A MD 6/14/20162:09 PM   Delayed entry:   Post cesarean section, the patient was repositioned to lithotomy and a speculum was inserted in the vagina. The cervix was prepped with betadine. The cerclage suture was grasped with a ring forceps, sectionned and removed. We could see that it had partially tore to anterior lip of the cervix on a 1 cm distance. There was no bleeding.

## 2015-06-09 NOTE — Progress Notes (Signed)
Despite Magnesium 4 g bolus, patient is now contracting every 3 minutes. VE: FT/100%/ -1 FHR with increase in variable decelerations Patient informed that I recommend to proceed with cesarean section: agreeable

## 2015-06-09 NOTE — Progress Notes (Signed)
Contractions every 5 minutes.  Cervix checked: closed/ 80 %/ LUS well developed with fetal part pressing Bedside ultrasound: frank breech without evidence of cord prolapse Dr Tora Kindred in NICU updated on events

## 2015-06-09 NOTE — Anesthesia Preprocedure Evaluation (Deleted)
Anesthesia Evaluation  Patient identified by MRN, date of birth, ID band Patient awake    Reviewed: Allergy & Precautions, NPO status , Patient's Chart, lab work & pertinent test resultsPreop documentation limited or incomplete due to emergent nature of procedure.  Airway Mallampati: II  TM Distance: >3 FB Neck ROM: Full    Dental   Pulmonary former smoker,          Cardiovascular     Neuro/Psych    GI/Hepatic   Endo/Other    Renal/GU      Musculoskeletal   Abdominal   Peds  Hematology   Anesthesia Other Findings   Reproductive/Obstetrics                            Anesthesia Physical Anesthesia Plan  ASA: III and emergent  Anesthesia Plan: Spinal   Post-op Pain Management:    Induction:   Airway Management Planned:   Additional Equipment:   Intra-op Plan:   Post-operative Plan:   Informed Consent:   Plan Discussed with:   Anesthesia Plan Comments:         Anesthesia Quick Evaluation

## 2015-06-09 NOTE — Anesthesia Postprocedure Evaluation (Signed)
  Anesthesia Post-op Note  Patient: Megan Turner  Procedure(s) Performed: Procedure(s) (LRB): CESAREAN SECTION (N/A)  Patient Location: PACU  Anesthesia Type: Spinal  Level of Consciousness: awake and alert   Airway and Oxygen Therapy: Patient Spontanous Breathing  Post-op Pain: mild  Post-op Assessment: Post-op Vital signs reviewed, Patient's Cardiovascular Status Stable, Respiratory Function Stable, Patent Airway and No signs of Nausea or vomiting  Last Vitals:  Filed Vitals:   06/09/15 1415  BP: 133/84  Pulse: 79  Temp:   Resp: 16    Post-op Vital Signs: stable   Complications: No apparent anesthesia complications

## 2015-06-09 NOTE — Anesthesia Preprocedure Evaluation (Addendum)
Anesthesia Evaluation  Patient identified by MRN, date of birth, ID band Patient awake    Reviewed: Allergy & Precautions, NPO status , Patient's Chart, lab work & pertinent test resultsPreop documentation limited or incomplete due to emergent nature of procedure.  History of Anesthesia Complications Negative for: history of anesthetic complications  Airway Mallampati: II  TM Distance: >3 FB Neck ROM: Full    Dental   Pulmonary former smoker,          Cardiovascular negative cardio ROS      Neuro/Psych negative neurological ROS  negative psych ROS   GI/Hepatic negative GI ROS, Neg liver ROS,   Endo/Other  obesity  Renal/GU negative Renal ROS  negative genitourinary   Musculoskeletal negative musculoskeletal ROS (+)   Abdominal   Peds negative pediatric ROS (+)  Hematology negative hematology ROS (+)   Anesthesia Other Findings   Reproductive/Obstetrics (+) Pregnancy                            Anesthesia Physical Anesthesia Plan  ASA: III and emergent  Anesthesia Plan: Spinal   Post-op Pain Management:    Induction:   Airway Management Planned:   Additional Equipment:   Intra-op Plan:   Post-operative Plan:   Informed Consent:   Plan Discussed with:   Anesthesia Plan Comments:         Anesthesia Quick Evaluation

## 2015-06-09 NOTE — Transfer of Care (Signed)
Immediate Anesthesia Transfer of Care Note  Patient: Megan Turner  Procedure(s) Performed: Procedure(s): CESAREAN SECTION (N/A)  Patient Location: PACU  Anesthesia Type:Spinal  Level of Consciousness: awake, alert  and oriented  Airway & Oxygen Therapy: Patient Spontanous Breathing  Post-op Assessment: Report given to RN and Post -op Vital signs reviewed and stable  Post vital signs: Reviewed and stable  Last Vitals:  Filed Vitals:   06/09/15 1056  BP: 117/68  Pulse: 86  Temp:   Resp: 22    Complications: No apparent anesthesia complications

## 2015-06-09 NOTE — Anesthesia Postprocedure Evaluation (Signed)
Anesthesia Post Note  Patient: Megan Turner  Procedure(s) Performed: Procedure(s) (LRB): CESAREAN SECTION (N/A)  Anesthesia type: Spinal  Patient location: Mother/Baby  Post pain: Pain level controlled  Post assessment: Post-op Vital signs reviewed  Last Vitals:  Filed Vitals:   06/09/15 1936  BP: 133/85  Pulse: 85  Temp: 37.3 C  Resp: 18    Post vital signs: Reviewed  Level of consciousness: awake  Complications: No apparent anesthesia complications

## 2015-06-09 NOTE — Progress Notes (Addendum)
Antepartum LOS: [place], 39 y.o.,   OB History    Gravida Para Term Preterm AB TAB SAB Ectopic Multiple Living   5 0   4 2 1 1 2  0      Obstetric Comments   Pregnancy #4--delivery of 1st twin on 04/20/15.  Twin A placenta still in situ, Twin B still viable.      Subjective -23.2 with cerclage 5/14, with PPROM Twin B on 6/6. Call from nurse reporting patient c/o pain and discomfort.  In to assess.  Patient reports pain similar to experiences with last delivery.  States pain starts in lower abdomen and radiates to lower back.  Reports increased vaginal discharge and no issues with urination, constipation, or diarrhea.   Objective  Filed Vitals:   06/08/15 1955 06/08/15 2148 06/09/15 0050 06/09/15 0716  BP: 113/59  120/65   Pulse: 80  75   Temp: 98.7 F (37.1 C) 98.9 F (37.2 C) 98.8 F (37.1 C) 98.5 F (36.9 C)  TempSrc: Oral Oral Oral Oral  Resp: 18  18   Height:      Weight:      SpO2:        No results found for this or any previous visit (from the past 24 hour(s)).  Meds: Scheduled Meds: . Docosahexaenoic Acid  2 capsule Oral Q1200  . docusate sodium  100 mg Oral BID  . fentaNYL (SUBLIMAZE) injection  100 mcg Intravenous Once  . ibuprofen  600 mg Oral Q6H  . NIFEdipine  10 mg Oral 3 times per day  . prenatal multivitamin  1 tablet Oral Q1200  . valACYclovir  500 mg Oral Daily   Continuous Infusions: . lactated ringers 125 mL/hr at 06/09/15 0527   PRN Meds:.acetaminophen, calcium carbonate, sodium chloride, zolpidem   Physical Exam  Constitutional: She is oriented to person, place, and time. She appears well-developed and well-nourished. She appears distressed (With intermittent pain).  Eyes: EOM are normal. Pupils are equal, round, and reactive to light.  Neck: Normal range of motion.  Cardiovascular: Normal rate and regular rhythm.   Pulmonary/Chest: Effort normal and breath sounds normal.  Abdominal: Soft. Bowel sounds are normal.  Intermittent  mild contractions palpated in lower abdominal areea  Genitourinary:  SVE: Cerclage palpated, Cervix Long/Thick  Musculoskeletal: Normal range of motion. She exhibits no edema.  Neurological: She is alert and oriented to person, place, and time.  Skin: Skin is warm and dry.  Psychiatric: She has a normal mood and affect. Her behavior is normal.  :   Monitoring Type:Continuous  Time:0800-0820 FHR: 155 bpm, Mod Var, + Variable Decels, +Accels UC: None graphed, mild palpated  Assessment IUP at [redacted]w[redacted]d Cat II FT PPROM Abdominal Cramping Back Pain Contractions  Plan Upcoming Treatments/Tests: CBC,  Bedside US for Presentation,  Consider MgSO4 for Neuroprotection,  Procardia 10mg  TID,  Clear Liquid Diet Dr.S. Jhovani Griswold to follow as appropriate  EMLY, JESSICA LYNN, MSN, CNM 06/09/2015, 8:09 AM   Seen and agreed 2 hours after pain medication, patient is complaining of recurrent pain: will start Magnesium sulfate for neuroprotection. Bedside u/s confirms transverse lie. Patient made aware that a cesarean section would be recommended if labor progresses +/- classical due to extreme prematurity. OR informed of possible cesarean section today

## 2015-06-09 NOTE — Progress Notes (Signed)
I saw Tineka briefly this morning before delivery, but she was serious pain.  I saw her again today after delivery.  She was so very grateful to get to see and touch her baby.  She stated that she remains both realistic and optimistic.    We will continue to be a support to her throughout her time and her baby's time here in the hospital, but please page as needs arise.  [place] Pager, [phone] 3:56 PM    06/09/15 1500  Clinical Encounter Type  Visited With Patient  Visit Type Spiritual support;Follow-up

## 2015-06-09 NOTE — Anesthesia Procedure Notes (Signed)
Spinal Patient location during procedure: OR Staffing Anesthesiologist: Reyhan Moronta Performed by: anesthesiologist  Preanesthetic Checklist Completed: patient identified, site marked, surgical consent, pre-op evaluation, timeout performed, IV checked, risks and benefits discussed and monitors and equipment checked Spinal Block Patient position: sitting Prep: ChloraPrep Patient monitoring: continuous pulse ox, blood pressure and heart rate Approach: midline Injection technique: single-shot Needle Needle type: Sprotte  Needle gauge: 24 G Needle length: 9 cm Additional Notes Functioning IV was confirmed and monitors were applied. Sterile prep and drape, including hand hygiene and sterile gloves were used. The patient was positioned and the spine was prepped. The skin was anesthetized with lidocaine.  Free flow of clear CSF was obtained prior to injecting local anesthetic into the CSF.  The spinal needle aspirated freely following injection.  The needle was carefully withdrawn.  The patient tolerated the procedure well.  Lauretta Grill, MD

## 2015-06-09 NOTE — Addendum Note (Signed)
Addendum  created 06/09/15 2123 by Flossie Dibble, CRNA   Modules edited: Notes Section   Notes Section:  File: [id number]

## 2015-06-09 NOTE — Progress Notes (Signed)
Dr Cletis Media at bedside SVE performed.  Patient is now 100 percent effaced.  She has called for stat section.

## 2015-06-09 NOTE — Lactation Note (Signed)
This note was copied from the chart of Megan Ruthie Berch. Lactation Consultation Note  Patient Name: Megan Turner [id number]'I Date: 06/09/2015 Reason for consult: Initial assessment;NICU baby  Baby 6 hours of life. Set mom up with DEBP, and got mom started pumping. Mom given NICU booklet with review. Mom return-demonstrated hand expression with colostrum present. Enc mom to pump every 2-3 hours/8 times per 24 hours, but to sleep at least 5-6 hours. Enc mom to call out for assistance as needed.  Maternal Data Has patient been taught Hand Expression?: Yes Does the patient have breastfeeding experience prior to this delivery?: No  Feeding    LATCH Score/Interventions                      Lactation Tools Discussed/Used Pump Review: Setup, frequency, and cleaning;Milk Storage Initiated by:: JW Date initiated:: 06/09/15   Consult Status Consult Status: Follow-up Date: 06/10/15 Follow-up type: In-patient    Inocente Salles 06/09/2015, 7:01 PM

## 2015-06-10 ENCOUNTER — Encounter (HOSPITAL_COMMUNITY): Payer: Self-pay | Admitting: Obstetrics and Gynecology

## 2015-06-10 DIAGNOSIS — Z98891 History of uterine scar from previous surgery: Secondary | ICD-10-CM

## 2015-06-10 LAB — CBC
HCT: 30.7 % — ABNORMAL LOW (ref 36.0–46.0)
HEMOGLOBIN: 10.4 g/dL — AB (ref 12.0–15.0)
MCH: 27.3 pg (ref 26.0–34.0)
MCHC: 33.9 g/dL (ref 30.0–36.0)
MCV: 80.6 fL (ref 78.0–100.0)
Platelets: 210 10*3/uL (ref 150–400)
RBC: 3.81 MIL/uL — AB (ref 3.87–5.11)
RDW: 13.9 % (ref 11.5–15.5)
WBC: 14.8 10*3/uL — ABNORMAL HIGH (ref 4.0–10.5)

## 2015-06-10 LAB — RPR: RPR Ser Ql: NONREACTIVE

## 2015-06-10 NOTE — Progress Notes (Signed)
Megan Turner [id number]  Subjective: Postpartum Day 1: Primary LTC/S with vertical extension, myomectomy, and cerclage removal--delivery at 23 2/7 weeks of Twin B, s/p SROM, delivery and passing of Twin A on 04/20/15, SROM Twin B 06/01/15, frank breech, with labor. Patient up to bedside--foley removed at 0430,  No spontaneous void yet. Feeding:  Pumping Contraceptive plan:  Undecided--hx IVF pregnancy Passing flatus  Reports baby is stable in NICU.  Objective: Temp:  [98.3 F (36.8 C)-99.8 F (37.7 C)] 98.3 F (36.8 C) (06/15 0513) Pulse Rate:  [70-96] 72 (06/15 0513) Resp:  [8-22] 15 (06/15 0513) BP: (97-143)/(57-86) 97/59 mmHg (06/15 0513) SpO2:  [95 %-100 %] 95 % (06/15 0513)   Filed Vitals:   06/09/15 2342 06/10/15 0120 06/10/15 0330 06/10/15 0513  BP: 114/67 122/69  97/59  Pulse: 83 81  72  Temp: 99.1 F (37.3 C) 99.2 F (37.3 C)  98.3 F (36.8 C)  TempSrc: Oral Oral  Oral  Resp: 18 16  15   Height:      Weight:      SpO2: 96% 97% 97% 95%   Orthostatics stable early am.  CBC Latest Ref Rng 06/10/2015 06/09/2015 06/01/2015  WBC 4.0 - 10.5 K/uL 14.8(H) 16.0(H) 14.6(H)  Hemoglobin 12.0 - 15.0 g/dL 10.4(L) 11.0(L) 11.2(L)  Hematocrit 36.0 - 46.0 % 30.7(L) 32.0(L) 31.9(L)  Platelets 150 - 400 K/uL 210 221 240     Physical Exam:  General: alert Lochia: appropriate Uterine Fundus: firm Abdomen:  + bowel sounds Incision: Honeycomb dressing CDI--small circle of old drainage marked DVT Evaluation: No evidence of DVT seen on physical exam. Negative Homan's sign.  Assessment/Plan: Status post cesarean delivery, day 1 NICU infant, delivery at 63  2/7 weeks. Stable Continue current care. Support to patient for NICU infant status.   Donnel Saxon MSN, CNM 06/10/2015, 8:01 AM

## 2015-06-11 NOTE — Clinical Social Work Maternal (Signed)
CLINICAL SOCIAL WORK MATERNAL/CHILD NOTE  Patient Details  Name: Megan Turner MRN: [id number] Date of Birth: 04/03/1976  Date:  2015/02/24  Clinical Social Worker Initiating Note:  [place] E. Brigitte Pulse, [place] Date/ Time Initiated:  06/11/15/1600     Child's Name:  Megan Turner   Legal Guardian:   (Parents: Megan Turner and Megan Turner)   Need for Interpreter:  None   Date of Referral:        Reason for Referral:   (No referral-NICU admission)   Referral Source:      Address:  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  Strengths:  Ability to meet basic needs , Understanding of illness, Compliance with medical plan    Risk Factors/Current Problems:   (Extreme Prematurity)   Cognitive State:  Alert , Insightful , Linear Thinking    Mood/Affect:  Bright , Calm , Relaxed , Interested    CSW Assessment: CSW met with MOB in her third floor room/309 to introduce myself, offer support and complete assessment due to baby's admission to NICU at 23.2 weeks.  MOB was extremely pleasant and welcoming of CSW's visit and states she has been wanting to meet CSW.  She reports having no particular questions or needs, but knows that most hospitals have social workers and seemed eager to talk.  MOB reports coping well at this time and that she remains "realistic and optimistic."  She also states  that she doesn't really know what to do at this point as far as being a mother of a "micro-preemie" because she refuses to just sit at [facility] bedside and get scared.  She states that the alarms tend to increase her anxiety and although she wants to spend time with baby and plans to, she does not plan to sit and stare at baby all day long while there is nothing she can do for her at this point.  She knows that she needs to take care of herself and she is focused on pumping and providing milk for baby.  CSW suggested that she get ear buds to use while at the bedside so that she can tune out some of her surroundings while she is with baby.  CSW explained that the monitors are being watched by the nurses and that she does not need to concern herself with them at this point.  She thought this was a very good idea.   CSW asked her how she is coping with the loss of baby A.  MOB states she did not want "an unhappy uterus" for baby B so she tried not to be depressed and cry too much.  She states she had her "moments" and estimates crying about once per week since the loss of twin A.  CSW encouraged her to seek counseling and or a support group sometime in the future  to allow her and her husband to grieve this loss.  CSW does not recommend a support group at this time since it is so soon after the loss and they need their focus to be on their living child.  CSW recommends that MOB consider grief counseling now or later depending on how she feels she is coping with the loss.  CSW provided her with information on Hospice Grief Counseling and [employer] Pregnancy and Infant Loss Support.  MOB was appreciative.  She states baby was cremated, but they have not picked up the ashes yet.  She has special plans for the ashes, such as a necklace and a special rose urn.  She hesitated, but told CSW that she has not ordered the urn yet because she does not want to order one if she is going to have to order two.  CSW encouraged her  to treat this order separately from any potential future orders.  Along the same lines, MOB states she is not sure how soon she should start preparing baby's room.  She states that she does not want to seem as though she does not have faith and wait to prepare a room, but also remains realistic that baby is critical.  CSW encouraged MOB to focus not on the nursery but on caring for herself, which will be a full time job the next few weeks.  MOB seemed appreciative.  MOB told CSW about a special bear that was given to her by the hospital when she had her loss, who now represents her baby to her.  CSW asked if she had considered putting baby's ashes inside the bear.  She states she has been thinking about this.  CSW asked if she named her son.  MOB informed CSW that although she and the midwife who was there at delivery thought that the baby was a boy, the autopsy report stated that the baby she lost was also a girl.  She reports coping well with this and states baby A's name is Langston.   CSW informed MOB of baby's eligibility for SSI and how to apply if she is interested.  MOB states she is interested so CSW had her sign a release of protected health information and provided her with a copy of baby's admission summary to take to the Social Security Administration.  MOB was very appreciative of the information and assistance.   CSW thanked MOB for sharing and discussed the importance of processing feelings in such an emotional time.  CSW explained ongoing support services offered by NICU CSW and asked her to call any time.  CSW was not able to discuss PPD signs and symptoms due to time, but will discuss at a future meeting with MOB.  CSW Plan/Description:  Patient/Family Education , Psychosocial Support and Ongoing Assessment of Needs, Information/Referral to Community Resources     Terah Robey Elizabeth, LCSW 06/11/2015, 5:04 PM 

## 2015-06-11 NOTE — Progress Notes (Signed)
Follow up. Patient and spouse talked about what the birth of their daughter Megan Turner means to them. They are hopefully optimistic about Megan Turner's progress.  Listening; presence.  Epifania Gore, PhD, [place]

## 2015-06-11 NOTE — Progress Notes (Signed)
Megan Turner [id number]  Subjective:   Postpartum Day 2: Primary LTC/S with vertical extension, myomectomy, and cerclage removal--delivery at 23 2/7 weeks of Twin B, s/p SROM, delivery and passing of Twin A on 04/20/15, SROM Twin B 06/01/15, frank breech, with labor.   Patient up ad lib, reports no syncope or dizziness.  Baby stable in NICU.  Patient and husband visiting frequently. Feeding:  Breast--pumping and hand expressing effectively. Contraceptive plan:  Hx IVF pregnancy  Objective: Temp:  [98.3 F (36.8 C)-99 F (37.2 C)] 99 F (37.2 C) (06/16 6438) Pulse Rate:  [83-107] 105 (06/16 0614) Resp:  [16-18] 18 (06/16 0614) BP: (105-120)/(65-84) 117/84 mmHg (06/16 0614) SpO2:  [95 %-100 %] 100 % (06/16 0614)   Recent Labs  06/09/15 0930 06/10/15 0523  HGB 11.0* 10.4*  HCT 32.0* 30.7*  WBC 16.0* 14.8*    Physical Exam:  General: alert Lochia: appropriate Uterine Fundus: firm Abdomen:  + bowel sounds Incision: Honeycomb dressing CDI, old drainage marked, no extension since yesterday DVT Evaluation: No evidence of DVT seen on physical exam. Negative Homan's sign.   Assessment/Plan: Status post cesarean delivery, day 2 PPROM, delivery of surviving twin at 77 2/7 weeks Stable Continue current care. Plan for discharge tomorrow  Support to patient for NICU infant    Donnel Saxon MSN, CNM 06/11/2015, 10:11 AM

## 2015-06-11 NOTE — Lactation Note (Signed)
This note was copied from the chart of Megan Turner. Lactation Consultation Note  Patient Name: Megan Turner [id number] Date: 06/11/2015 Reason for consult: Follow-up assessment;NICU baby  NICU baby 66 hours old. Mom just finishing pumping in the room, and is getting over 60ms of transitional breast milk. Enc mom to use regular setting on DEBP. Mom given 2-week pump rental papers because she is not sure when her personal pump will arrive, and discussed benefits of hospital grade DEBP as well. Mom aware of pumping rooms in NICU, and knows to take tubing and kit with her when she is D/C'd. Discussed engorgement prevention/treatment. Mom given breast pads and enc to discard often. Mom aware of OP/BFSG and [place] assistance after D/C. Maternal Data    Feeding Feeding Type: Breast Milk  LATCH Score/Interventions                      Lactation Tools Discussed/Used     Consult Status Consult Status: Follow-up Date: 06/12/15 Follow-up type: In-patient    Megan Salles6/16/2016, 3:49 PM

## 2015-06-12 MED ORDER — OXYCODONE-ACETAMINOPHEN 5-325 MG PO TABS: 1.0000 | ORAL_TABLET | ORAL | Status: DC | PRN

## 2015-06-12 MED ORDER — FERROUS SULFATE 325 (65 FE) MG PO TABS: 325.0000 mg | ORAL_TABLET | Freq: Two times a day (BID) | ORAL | Status: DC

## 2015-06-12 MED ORDER — IBUPROFEN 600 MG PO TABS: 600.0000 mg | ORAL_TABLET | Freq: Four times a day (QID) | ORAL | Status: DC

## 2015-06-12 NOTE — Discharge Summary (Signed)
Cesarean Section Delivery Discharge Summary  Megan Turner  DOB:    [birth year] MRN:    [id number] CSN:    [id number]  Date of admission:                  06/01/15  Date of discharge:                   06/12/15  Procedures this admission:  CS  Date of Delivery: 06/12/15  Newborn Data:  Live born  Information for the patient's newborn:  Saia, Girl Michalle [[id number]]  female   Live born female  Birth Weight: 1 lb 2 oz (510 g) APGAR: 2, 4  Home with mother. 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 female, [id number], who presents at [redacted]w[redacted]d weeks gestation. The patient has been followed at the [facility] and Gynecology division of [facility].    Her pregnancy has been complicated by:  Patient Active Problem List   Diagnosis Date Noted  . Status post primary low transverse cesarean section--breech, oligo, in labor. 06/10/2015  . Twin pregnancy with fetal loss and retention of one fetus   . PROM (premature rupture of membranes)   . Oligohydramnios delivered   . Cervical cerclage suture present 05/23/2015  . Infertility - female 04/20/2015  . Incomplete miscarriage 04/20/2015  . Elderly multigravida 04/20/2015  . Twin pregnancy, twins dichorionic and diamniotic 04/20/2015  . Severe obesity (BMI >= 40) 04/20/2015  . Cervical polyp 04/20/2015  . Subchorionic hemorrhage 04/20/2015  . BV (bacterial vaginosis) 04/20/2015  . Fibroids, intramural 04/20/2015  . Fibroids, subserous 04/20/2015    Hospital course: The patient was admitted for PPROM of twin B, SP IUFD of twin A at 16.1.   Her postpartum course was not complicated.  She was discharged to home on postpartum day 3 doing well.  Feeding: Breast pumping  Contraception: no method   Discharge hemoglobin: HEMOGLOBIN  Date Value Ref Range Status  06/10/2015 10.4* 12.0 - 15.0 g/dL Final   HCT  Date Value Ref Range Status  06/10/2015 30.7* 36.0  - 46.0 % Final   Anemia - hemodynamicly stable.  PreNatal Labs ABO, Rh: --/--/B POS (06/12 2025)   Antibody: NEG (06/12 2025) Rubella:    immune RPR: Non Reactive (06/15 0523)  HBsAg: Negative (03/10 0000)  HIV: Non-reactive (03/10 0000)  GBS: Negative (03/10 0000)  Discharge Physical Exam:  General: alert and cooperative Lochia: appropriate Uterine Fundus: firm Incision: healing well DVT Evaluation: No evidence of DVT seen on physical exam.  Intrapartum Procedures: cesarean: low cervical, transverse Postpartum Procedures: none Complications-Operative and Postpartum: none  Discharge Diagnoses: Premature labor,  asymptomatic anemia  Discharge Information:  Activity:           pelvic rest Diet:                routine Medications: PNV, Ibuprofen, Iron and Percocet Condition:      stable   Discharge to: home  Follow-up Information    Follow up with [facility] In 6 weeks.   Specialty:  Obstetrics and Gynecology   Why:  Postpartum check up   Contact information:   [address] [place] [address] [phone]       Nialah Saravia, CNM, MSN  06/12/2015. 11:46 AM  Care After Cesarean Delivery  Refer to this sheet in the next few weeks. These instructions provide you with  information on caring for yourself after your procedure. Your caregiver may also give you specific instructions. Your treatment has been planned according to current medical practices, but problems sometimes occur. Call your caregiver if you have any problems or questions after you go home. HOME CARE INSTRUCTIONS  Only take over-the-counter or prescription medicines as directed by your caregiver.  Do not drink alcohol, especially if you are breastfeeding or taking medicine to relieve pain.  Do not chew or smoke tobacco.  Continue to use good perineal care. Good perineal care includes:  Wiping your perineum from front to  back.  Keeping your perineum clean.  Check your cut (incision) daily for increased redness, drainage, swelling, or separation of skin.  Clean your incision gently with soap and water every day, and then pat it dry. If your caregiver says it is okay, leave the incision uncovered. Use a bandage (dressing) if the incision is draining fluid or appears irritated. If the adhesive strips across the incision do not fall off within 7 days, carefully peel them off.  Hug a pillow when coughing or sneezing until your incision is healed. This helps to relieve pain.  Do not use tampons or douche until your caregiver says it is okay.  Shower, wash your hair, and take tub baths as directed by your caregiver.  Wear a well-fitting bra that provides breast support.  Limit wearing support panties or control-top hose.  Drink enough fluids to keep your urine clear or pale yellow.  Eat high-fiber foods such as whole grain cereals and breads, brown rice, beans, and fresh fruits and vegetables every day. These foods may help prevent or relieve constipation.  Resume activities such as climbing stairs, driving, lifting, exercising, or traveling as directed by your caregiver.  Talk to your caregiver about resuming sexual activities. This is dependent upon your risk of infection, your rate of healing, and your comfort and desire to resume sexual activity.  Try to have someone help you with your household activities and your newborn for at least a few days after you leave the hospital.  Rest as much as possible. Try to rest or take a nap when your newborn is sleeping.  Increase your activities gradually.  Keep all of your scheduled postpartum appointments. It is very important to keep your scheduled follow-up appointments. At these appointments, your caregiver will be checking to make sure that you are healing physically and emotionally. SEEK MEDICAL CARE IF:   You are passing large clots from your vagina. Save  any clots to show your caregiver.  You have a foul smelling discharge from your vagina.  You have trouble urinating.  You are urinating frequently.  You have pain when you urinate.  You have a change in your bowel movements.  You have increasing redness, pain, or swelling near your incision.  You have pus draining from your incision.  Your incision is separating.  You have painful, hard, or reddened breasts.  You have a severe headache.  You have blurred vision or see spots.  You feel sad or depressed.  You have thoughts of hurting yourself or your newborn.  You have questions about your care, the care of your newborn, or medicines.  You are dizzy or lightheaded.  You have a rash.  You have pain, redness, or swelling at the site of the removed intravenous access (IV) tube.  You have nausea or vomiting.  You stopped breastfeeding and have not had a menstrual period within 12 weeks of stopping.  You are not breastfeeding and have not had a menstrual period within 12 weeks of delivery.  You have a fever. SEEK IMMEDIATE MEDICAL CARE IF:  You have persistent pain.  You have chest pain.  You have shortness of breath.  You faint.  You have leg pain.  You have stomach pain.  Your vaginal bleeding saturates 2 or more sanitary pads in 1 hour. MAKE SURE YOU:   Understand these instructions.  Will watch your condition.  Will get help right away if you are not doing well or get worse. Document Released: 09/03/2002 Document Revised: 09/05/2012 Document Reviewed: 08/08/2012 [facility] Patient Information 2014 [place].   Postpartum Depression and Baby Blues  The postpartum period begins right after the birth of a baby. During this time, there is often a great amount of joy and excitement. It is also a time of considerable changes in the life of the parent(s). Regardless of how many times a mother gives birth, each child brings new challenges and dynamics  to the family. It is not unusual to have feelings of excitement accompanied by confusing shifts in moods, emotions, and thoughts. All mothers are at risk of developing postpartum depression or the "baby blues." These mood changes can occur right after giving birth, or they may occur many months after giving birth. The baby blues or postpartum depression can be mild or severe. Additionally, postpartum depression can resolve rather quickly, or it can be a long-term condition. CAUSES Elevated hormones and their rapid decline are thought to be a main cause of postpartum depression and the baby blues. There are a number of hormones that radically change during and after pregnancy. Estrogen and progesterone usually decrease immediately after delivering your baby. The level of thyroid hormone and various cortisol steroids also rapidly drop. Other factors that play a major role in these changes include major life events and genetics.  RISK FACTORS If you have any of the following risks for the baby blues or postpartum depression, know what symptoms to watch out for during the postpartum period. Risk factors that may increase the likelihood of getting the baby blues or postpartum depression include: 1. Havinga personal or family history of depression. 2. Having depression while being pregnant. 3. Having premenstrual or oral contraceptive-associated mood issues. 4. Having exceptional life stress. 5. Having marital conflict. 6. Lacking a social support network. 7. Having a baby with special needs. 8. Having health problems such as diabetes. SYMPTOMS Baby blues symptoms include:  Brief fluctuations in mood, such as going from extreme happiness to sadness.  Decreased concentration.  Difficulty sleeping.  Crying spells, tearfulness.  Irritability.  Anxiety. Postpartum depression symptoms typically begin within the first month after giving birth. These symptoms include:  Difficulty sleeping or  excessive sleepiness.  Marked weight loss.  Agitation.  Feelings of worthlessness.  Lack of interest in activity or food. Postpartum psychosis is a very concerning condition and can be dangerous. Fortunately, it is rare. Displaying any of the following symptoms is cause for immediate medical attention. Postpartum psychosis symptoms include:  Hallucinations and delusions.  Bizarre or disorganized behavior.  Confusion or disorientation. DIAGNOSIS  A diagnosis is made by an evaluation of your symptoms. There are no medical or lab tests that lead to a diagnosis, but there are various questionnaires that a caregiver may use to identify those with the baby blues, postpartum depression, or psychosis. Often times, a screening tool called the [place] Postnatal Depression Scale is used to diagnose depression in the postpartum period.  TREATMENT The baby blues usually goes away on its own in 1 to 2 weeks. Social support is often all that is needed. You should be encouraged to get adequate sleep and rest. Occasionally, you may be given medicines to help you sleep.  Postpartum depression requires treatment as it can last several months or longer if it is not treated. Treatment may include individual or group therapy, medicine, or both to address any social, physiological, and psychological factors that may play a role in the depression. Regular exercise, a healthy diet, rest, and social support may also be strongly recommended.  Postpartum psychosis is more serious and needs treatment right away. Hospitalization is often needed. HOME CARE INSTRUCTIONS  Get as much rest as you can. Nap when the baby sleeps.  Exercise regularly. Some women find yoga and walking to be beneficial.  Eat a balanced and nourishing diet.  Do little things that you enjoy. Have a cup of tea, take a bubble bath, read your favorite magazine, or listen to your favorite music.  Avoid alcohol.  Ask for help with household  chores, cooking, grocery shopping, or running errands as needed. Do not try to do everything.  Talk to people close to you about how you are feeling. Get support from your partner, family members, friends, or other new moms.  Try to stay positive in how you think. Think about the things you are grateful for.  Do not spend a lot of time alone.  Only take medicine as directed by your caregiver.  Keep all your postpartum appointments.  Let your caregiver know if you have any concerns. SEEK MEDICAL CARE IF: You are having a reaction or problems with your medicine. SEEK IMMEDIATE MEDICAL CARE IF:  You have suicidal feelings.  You feel you may harm the baby or someone else. Document Released: 09/15/2004 Document Revised: 03/05/2012 Document Reviewed: 10/18/2011 [facility] Patient Information 2014 [place], [place].   Breastfeeding Deciding to breastfeed is one of the best choices you can make for you and your baby. A change in hormones during pregnancy causes your breast tissue to grow and increases the number and size of your milk ducts. These hormones also allow proteins, sugars, and fats from your blood supply to make breast milk in your milk-producing glands. Hormones prevent breast milk from being released before your baby is born as well as prompt milk flow after birth. Once breastfeeding has begun, thoughts of your baby, as well as his or her sucking or crying, can stimulate the release of milk from your milk-producing glands.  BENEFITS OF BREASTFEEDING For Your Baby  Your first milk (colostrum) helps your baby's digestive system function better.   There are antibodies in your milk that help your baby fight off infections.   Your baby has a lower incidence of asthma, allergies, and sudden infant death syndrome.   The nutrients in breast milk are better for your baby than infant formulas and are designed uniquely for your baby's needs.   Breast milk improves your baby's brain  development.   Your baby is less likely to develop other conditions, such as childhood obesity, asthma, or type 2 diabetes mellitus.  For You   Breastfeeding helps to create a very special bond between you and your baby.   Breastfeeding is convenient. Breast milk is always available at the correct temperature and costs nothing.   Breastfeeding helps to burn calories and helps you lose the weight gained during pregnancy.   Breastfeeding makes your uterus contract to its  prepregnancy size faster and slows bleeding (lochia) after you give birth.   Breastfeeding helps to lower your risk of developing type 2 diabetes mellitus, osteoporosis, and breast or ovarian cancer later in life. SIGNS THAT YOUR BABY IS HUNGRY Early Signs of Hunger  Increased alertness or activity.  Stretching.  Movement of the head from side to side.  Movement of the head and opening of the mouth when the corner of the mouth or cheek is stroked (rooting).  Increased sucking sounds, smacking lips, cooing, sighing, or squeaking.  Hand-to-mouth movements.  Increased sucking of fingers or hands. Late Signs of Hunger  Fussing.  Intermittent crying. Extreme Signs of Hunger Signs of extreme hunger will require calming and consoling before your baby will be able to breastfeed successfully. Do not wait for the following signs of extreme hunger to occur before you initiate breastfeeding:   Restlessness.  A loud, strong cry.   Screaming.  BREASTFEEDING BASICS Breastfeeding Initiation  Find a comfortable place to sit or lie down, with your neck and back well supported.  Place a pillow or rolled up blanket under your baby to bring him or her to the level of your breast (if you are seated). Nursing pillows are specially designed to help support your arms and your baby while you breastfeed.  Make sure that your baby's abdomen is facing your abdomen.   Gently massage your breast. With your fingertips,  massage from your chest wall toward your nipple in a circular motion. This encourages milk flow. You may need to continue this action during the feeding if your milk flows slowly.  Support your breast with 4 fingers underneath and your thumb above your nipple. Make sure your fingers are well away from your nipple and your baby's mouth.   Stroke your baby's lips gently with your finger or nipple.   When your baby's mouth is open wide enough, quickly bring your baby to your breast, placing your entire nipple and as much of the colored area around your nipple (areola) as possible into your baby's mouth.   More areola should be visible above your baby's upper lip than below the lower lip.   Your baby's tongue should be between his or her lower gum and your breast.   Ensure that your baby's mouth is correctly positioned around your nipple (latched). Your baby's lips should create a seal on your breast and be turned out (everted).  It is common for your baby to suck about 2-3 minutes in order to start the flow of breast milk. Latching Teaching your baby how to latch on to your breast properly is very important. An improper latch can cause nipple pain and decreased milk supply for you and poor weight gain in your baby. Also, if your baby is not latched onto your nipple properly, he or she may swallow some air during feeding. This can make your baby fussy. Burping your baby when you switch breasts during the feeding can help to get rid of the air. However, teaching your baby to latch on properly is still the best way to prevent fussiness from swallowing air while breastfeeding. Signs that your baby has successfully latched on to your nipple:    Silent tugging or silent sucking, without causing you pain.   Swallowing heard between every 3-4 sucks.    Muscle movement above and in front of his or her ears while sucking.  Signs that your baby has not successfully latched on to nipple:    Sucking sounds  or smacking sounds from your baby while breastfeeding.  Nipple pain. If you think your baby has not latched on correctly, slip your finger into the corner of your baby's mouth to break the suction and place it between your baby's gums. Attempt breastfeeding initiation again. Signs of Successful Breastfeeding Signs from your baby:   A gradual decrease in the number of sucks or complete cessation of sucking.   Falling asleep.   Relaxation of his or her body.   Retention of a small amount of milk in his or her mouth.   Letting go of your breast by himself or herself. Signs from you:  Breasts that have increased in firmness, weight, and size 1-3 hours after feeding.   Breasts that are softer immediately after breastfeeding.  Increased milk volume, as well as a change in milk consistency and color by the fifth day of breastfeeding.   Nipples that are not sore, cracked, or bleeding. Signs That Your [employer]el Books is Getting Enough Milk  Wetting at least 3 diapers in a 24-hour period. The urine should be clear and pale yellow by age 90 days.  At least 3 stools in a 24-hour period by age 90 days. The stool should be soft and yellow.  At least 3 stools in a 24-hour period by age 19 days. The stool should be seedy and yellow.  No loss of weight greater than 10% of birth weight during the first 24 days of age.  Average weight gain of 4-7 ounces (113-198 g) per week after age 55 days.  Consistent daily weight gain by age 66 days, without weight loss after the age of 2 weeks. After a feeding, your baby may spit up a small amount. This is common. BREASTFEEDING FREQUENCY AND DURATION Frequent feeding will help you make more milk and can prevent sore nipples and breast engorgement. Breastfeed when you feel the need to reduce the fullness of your breasts or when your baby shows signs of hunger. This is called "breastfeeding on demand." Avoid introducing a pacifier to your baby while  you are working to establish breastfeeding (the first 4-6 weeks after your baby is born). After this time you may choose to use a pacifier. Research has shown that pacifier use during the first year of a baby's life decreases the risk of sudden infant death syndrome (SIDS). Allow your baby to feed on each breast as long as he or she wants. Breastfeed until your baby is finished feeding. When your baby unlatches or falls asleep while feeding from the first breast, offer the second breast. Because newborns are often sleepy in the first few weeks of life, you may need to awaken your baby to get him or her to feed. Breastfeeding times will vary from baby to baby. However, the following rules can serve as a guide to help you ensure that your baby is properly fed:  Newborns (babies 63 weeks of age or younger) may breastfeed every 1-3 hours.  Newborns should not go longer than 3 hours during the day or 5 hours during the night without breastfeeding.  You should breastfeed your baby a minimum of 8 times in a 24-hour period until you begin to introduce solid foods to your baby at around 34 months of age. BREAST MILK PUMPING Pumping and storing breast milk allows you to ensure that your baby is exclusively fed your breast milk, even at times when you are unable to breastfeed. This is especially important if you are going back to work while  you are still breastfeeding or when you are not able to be present during feedings. Your lactation consultant can give you guidelines on how long it is safe to store breast milk.  A breast pump is a machine that allows you to pump milk from your breast into a sterile bottle. The pumped breast milk can then be stored in a refrigerator or freezer. Some breast pumps are operated by hand, while others use electricity. Ask your lactation consultant which type will work best for you. Breast pumps can be purchased, but some hospitals and breastfeeding support groups lease breast pumps on  a monthly basis. A lactation consultant can teach you how to hand express breast milk, if you prefer not to use a pump.  CARING FOR YOUR BREASTS WHILE YOU BREASTFEED Nipples can become dry, cracked, and sore while breastfeeding. The following recommendations can help keep your breasts moisturized and healthy:  Avoid using soap on your nipples.   Wear a supportive bra. Although not required, special nursing bras and tank tops are designed to allow access to your breasts for breastfeeding without taking off your entire bra or top. Avoid wearing underwire-style bras or extremely tight bras.  Air dry your nipples for 3-81minutes after each feeding.   Use only cotton bra pads to absorb leaked breast milk. Leaking of breast milk between feedings is normal.   Use lanolin on your nipples after breastfeeding. Lanolin helps to maintain your skin's normal moisture barrier. If you use pure lanolin, you do not need to wash it off before feeding your baby again. Pure lanolin is not toxic to your baby. You may also hand express a few drops of breast milk and gently massage that milk into your nipples and allow the milk to air dry. In the first few weeks after giving birth, some women experience extremely full breasts (engorgement). Engorgement can make your breasts feel heavy, warm, and tender to the touch. Engorgement peaks within 3-5 days after you give birth. The following recommendations can help ease engorgement:  Completely empty your breasts while breastfeeding or pumping. You may want to start by applying warm, moist heat (in the shower or with warm water-soaked hand towels) just before feeding or pumping. This increases circulation and helps the milk flow. If your baby does not completely empty your breasts while breastfeeding, pump any extra milk after he or she is finished.  Wear a snug bra (nursing or regular) or tank top for 1-2 days to signal your body to slightly decrease milk  production.  Apply ice packs to your breasts, unless this is too uncomfortable for you.  Make sure that your baby is latched on and positioned properly while breastfeeding. If engorgement persists after 48 hours of following these recommendations, contact your health care provider or a Science writer. OVERALL HEALTH CARE RECOMMENDATIONS WHILE BREASTFEEDING  Eat healthy foods. Alternate between meals and snacks, eating 3 of each per day. Because what you eat affects your breast milk, some of the foods may make your baby more irritable than usual. Avoid eating these foods if you are sure that they are negatively affecting your baby.  Drink milk, fruit juice, and water to satisfy your thirst (about 10 glasses a day).   Rest often, relax, and continue to take your prenatal vitamins to prevent fatigue, stress, and anemia.  Continue breast self-awareness checks.  Avoid chewing and smoking tobacco.  Avoid alcohol and drug use. Some medicines that may be harmful to your baby can pass through breast  milk. It is important to ask your health care provider before taking any medicine, including all over-the-counter and prescription medicine as well as vitamin and herbal supplements. It is possible to become pregnant while breastfeeding. If birth control is desired, ask your health care provider about options that will be safe for your baby. SEEK MEDICAL CARE IF:   You feel like you want to stop breastfeeding or have become frustrated with breastfeeding.  You have painful breasts or nipples.  Your nipples are cracked or bleeding.  Your breasts are red, tender, or warm.  You have a swollen area on either breast.  You have a fever or chills.  You have nausea or vomiting.  You have drainage other than breast milk from your nipples.  Your breasts do not become full before feedings by the fifth day after you give birth.  You feel sad and depressed.  Your baby is too sleepy to eat  well.  Your baby is having trouble sleeping.   Your baby is wetting less than 3 diapers in a 24-hour period.  Your baby has less than 3 stools in a 24-hour period.  Your baby's skin or the white part of his or her eyes becomes yellow.   Your baby is not gaining weight by 40 days of age. SEEK IMMEDIATE MEDICAL CARE IF:   Your baby is overly tired (lethargic) and does not want to wake up and feed.  Your baby develops an unexplained fever. Document Released: 12/12/2005 Document Revised: 12/17/2013 Document Reviewed: 06/05/2013 [facility] Patient Information 2015 [place], [place]. This information is not intended to replace advice given to you by your health care provider. Make sure you discuss any questions you have with your health care provider.

## 2015-06-12 NOTE — Progress Notes (Signed)
Patient discharged home with husband... Discharge instructions reviewed with patient and she verbalized understanding... Condition stable... No equipment... Ambulated to car with Janalyn Shy, RN.

## 2015-06-12 NOTE — Lactation Note (Signed)
Lactation Consultation Note  Patient Name: Lexany Belknap ZHYQM'[id number] Date: 06/12/2015    Mother discharged to home today after visit with baby in NICU.Marland Kitchen She has been pumping routinely and expressing 60 ml. She has received her DEBP from the insurance company.    Maternal Data    Feeding    LATCH Score/Interventions                      Lactation Tools Discussed/Used     Consult Status      Gwenevere Abbot 06/12/2015, 5:46 PM

## 2015-06-16 ENCOUNTER — Ambulatory Visit (HOSPITAL_COMMUNITY): Payer: BC Managed Care – PPO

## 2015-06-16 ENCOUNTER — Ambulatory Visit: Payer: Self-pay

## 2015-06-16 NOTE — Lactation Note (Signed)
This note was copied from the chart of Megan Breyon Blass. Lactation Consultation Note  Follow up with this mom who lost a twin at 16 weeks, and delivered the second baby at under 24 weeks. She is open to tallking about her journey with these babies, and mentioned Micah Noel, Education officer, museum as a great support.  I assisted mom with pump, and increased her to size 27 flanges. Mom said she no longer had discomfort with pumping, and was pleased to see how much better her milk was flowing. Mom has a good milk supply, and was show how to add compression with pumping, while wearing her hands free bra. Mom very receptive to teaching, and reports she felt much better, for "lots of reasons" after the consult. Mom knows to call for questions/concerns.      Patient Name: Megan Turner NVBTY'O Date: 06/16/2015     Maternal Data    Feeding    LATCH Score/Interventions                      Lactation Tools Discussed/Used     Consult Status      Tonna Corner 06/16/2015, 4:36 PM

## 2015-09-02 DIAGNOSIS — N96 Recurrent pregnancy loss: Secondary | ICD-10-CM | POA: Insufficient documentation

## 2016-01-13 ENCOUNTER — Other Ambulatory Visit: Payer: Self-pay | Admitting: Family Medicine

## 2016-01-13 DIAGNOSIS — R1011 Right upper quadrant pain: Secondary | ICD-10-CM

## 2016-01-13 DIAGNOSIS — R748 Abnormal levels of other serum enzymes: Secondary | ICD-10-CM

## 2016-01-20 ENCOUNTER — Ambulatory Visit
Admission: RE | Admit: 2016-01-20 | Discharge: 2016-01-20 | Disposition: A | Discharge status: Home / Self Care | Payer: BC Managed Care – PPO | Source: Ambulatory Visit | Attending: Family Medicine | Admitting: Family Medicine

## 2016-01-20 DIAGNOSIS — R1011 Right upper quadrant pain: Secondary | ICD-10-CM

## 2016-01-20 DIAGNOSIS — R748 Abnormal levels of other serum enzymes: Secondary | ICD-10-CM

## 2016-02-22 ENCOUNTER — Other Ambulatory Visit: Payer: Self-pay | Admitting: Gastroenterology

## 2016-02-22 DIAGNOSIS — R140 Abdominal distension (gaseous): Secondary | ICD-10-CM

## 2016-02-29 ENCOUNTER — Ambulatory Visit
Admission: RE | Admit: 2016-02-29 | Discharge: 2016-02-29 | Disposition: A | Discharge status: Home / Self Care | Payer: BC Managed Care – PPO | Source: Ambulatory Visit | Attending: Gastroenterology | Admitting: Gastroenterology

## 2016-02-29 DIAGNOSIS — R140 Abdominal distension (gaseous): Secondary | ICD-10-CM

## 2016-03-04 ENCOUNTER — Other Ambulatory Visit: Payer: Self-pay | Admitting: Obstetrics and Gynecology

## 2016-03-07 ENCOUNTER — Ambulatory Visit
Admission: RE | Admit: 2016-03-07 | Discharge: 2016-03-07 | Disposition: A | Discharge status: Home / Self Care | Payer: BC Managed Care – PPO | Source: Ambulatory Visit | Attending: Gastroenterology | Admitting: Gastroenterology

## 2016-03-07 DIAGNOSIS — R140 Abdominal distension (gaseous): Secondary | ICD-10-CM

## 2016-03-11 ENCOUNTER — Other Ambulatory Visit: Payer: Self-pay | Admitting: Family Medicine

## 2016-03-11 DIAGNOSIS — R748 Abnormal levels of other serum enzymes: Secondary | ICD-10-CM

## 2016-03-18 ENCOUNTER — Ambulatory Visit
Admission: RE | Admit: 2016-03-18 | Discharge: 2016-03-18 | Disposition: A | Discharge status: Home / Self Care | Payer: BC Managed Care – PPO | Source: Ambulatory Visit | Attending: Family Medicine | Admitting: Family Medicine

## 2016-03-18 DIAGNOSIS — R748 Abnormal levels of other serum enzymes: Secondary | ICD-10-CM

## 2016-03-18 MED ORDER — IOPAMIDOL (ISOVUE-300) INJECTION 61%
100.0000 mL | Freq: Once | INTRAVENOUS | Status: AC | PRN
  Administered 2016-03-18: 100 mL via INTRAVENOUS

## 2016-03-24 ENCOUNTER — Other Ambulatory Visit: Payer: Self-pay | Admitting: Family Medicine

## 2016-03-24 DIAGNOSIS — E278 Other specified disorders of adrenal gland: Secondary | ICD-10-CM

## 2016-04-05 ENCOUNTER — Ambulatory Visit
Admission: RE | Admit: 2016-04-05 | Discharge: 2016-04-05 | Disposition: A | Discharge status: Home / Self Care | Payer: BC Managed Care – PPO | Source: Ambulatory Visit | Attending: Family Medicine | Admitting: Family Medicine

## 2016-04-05 DIAGNOSIS — E278 Other specified disorders of adrenal gland: Secondary | ICD-10-CM

## 2016-04-05 MED ORDER — GADOBENATE DIMEGLUMINE 529 MG/ML IV SOLN
20.0000 mL | Freq: Once | INTRAVENOUS | Status: AC | PRN
  Administered 2016-04-05: 20 mL via INTRAVENOUS

## 2017-03-25 ENCOUNTER — Encounter: Payer: Self-pay | Admitting: Obstetrics and Gynecology

## 2017-03-25 DIAGNOSIS — Z8759 Personal history of other complications of pregnancy, childbirth and the puerperium: Secondary | ICD-10-CM | POA: Insufficient documentation

## 2018-01-24 DIAGNOSIS — IMO0002 Reserved for concepts with insufficient information to code with codable children: Secondary | ICD-10-CM | POA: Insufficient documentation

## 2018-01-30 ENCOUNTER — Other Ambulatory Visit: Payer: Self-pay | Admitting: Orthopedic Surgery

## 2018-01-30 DIAGNOSIS — IMO0002 Reserved for concepts with insufficient information to code with codable children: Secondary | ICD-10-CM

## 2018-01-30 DIAGNOSIS — R229 Localized swelling, mass and lump, unspecified: Principal | ICD-10-CM

## 2018-02-08 ENCOUNTER — Ambulatory Visit
Admission: RE | Admit: 2018-02-08 | Discharge: 2018-02-08 | Disposition: A | Discharge status: Home / Self Care | Payer: BC Managed Care – PPO | Source: Ambulatory Visit | Attending: Orthopedic Surgery | Admitting: Orthopedic Surgery

## 2018-02-08 DIAGNOSIS — R229 Localized swelling, mass and lump, unspecified: Principal | ICD-10-CM

## 2018-02-08 DIAGNOSIS — IMO0002 Reserved for concepts with insufficient information to code with codable children: Secondary | ICD-10-CM

## 2018-02-26 ENCOUNTER — Other Ambulatory Visit: Payer: Self-pay | Admitting: Orthopedic Surgery

## 2018-06-25 ENCOUNTER — Encounter (HOSPITAL_BASED_OUTPATIENT_CLINIC_OR_DEPARTMENT_OTHER): Payer: Self-pay | Admitting: *Deleted

## 2018-06-25 ENCOUNTER — Other Ambulatory Visit: Payer: Self-pay

## 2018-07-03 ENCOUNTER — Encounter (HOSPITAL_BASED_OUTPATIENT_CLINIC_OR_DEPARTMENT_OTHER): Payer: Self-pay

## 2018-07-03 ENCOUNTER — Ambulatory Visit (HOSPITAL_BASED_OUTPATIENT_CLINIC_OR_DEPARTMENT_OTHER): Payer: BC Managed Care – PPO | Admitting: Anesthesiology

## 2018-07-03 ENCOUNTER — Ambulatory Visit (HOSPITAL_BASED_OUTPATIENT_CLINIC_OR_DEPARTMENT_OTHER)
Admission: RE | Admit: 2018-07-03 | Discharge: 2018-07-03 | Disposition: A | Discharge status: Home / Self Care | Payer: BC Managed Care – PPO | Source: Ambulatory Visit | Attending: Orthopedic Surgery | Admitting: Orthopedic Surgery

## 2018-07-03 ENCOUNTER — Other Ambulatory Visit: Payer: Self-pay

## 2018-07-03 ENCOUNTER — Encounter (HOSPITAL_BASED_OUTPATIENT_CLINIC_OR_DEPARTMENT_OTHER)
Admission: RE | Disposition: A | Discharge status: Home / Self Care | Payer: Self-pay | Source: Ambulatory Visit | Attending: Orthopedic Surgery

## 2018-07-03 DIAGNOSIS — D367 Benign neoplasm of other specified sites: Secondary | ICD-10-CM | POA: Diagnosis not present

## 2018-07-03 DIAGNOSIS — Z87891 Personal history of nicotine dependence: Secondary | ICD-10-CM | POA: Insufficient documentation

## 2018-07-03 DIAGNOSIS — R229 Localized swelling, mass and lump, unspecified: Secondary | ICD-10-CM | POA: Diagnosis present

## 2018-07-03 HISTORY — PX: MASS EXCISION: SHX2000

## 2018-07-03 SURGERY — EXCISION MASS
Anesthesia: Monitor Anesthesia Care | Site: Thumb | Laterality: Right

## 2018-07-03 MED ORDER — CEFAZOLIN SODIUM-DEXTROSE 2-4 GM/100ML-% IV SOLN
2.0000 g | INTRAVENOUS | Status: AC
  Administered 2018-07-03: 2 g via INTRAVENOUS

## 2018-07-03 MED ORDER — DEXAMETHASONE SODIUM PHOSPHATE 10 MG/ML IJ SOLN
INTRAMUSCULAR | Status: AC
  Filled 2018-07-03: qty 1

## 2018-07-03 MED ORDER — LIDOCAINE HCL (CARDIAC) PF 100 MG/5ML IV SOSY
PREFILLED_SYRINGE | INTRAVENOUS | Status: AC
  Filled 2018-07-03: qty 5

## 2018-07-03 MED ORDER — SCOPOLAMINE 1 MG/3DAYS TD PT72: 1.0000 | MEDICATED_PATCH | Freq: Once | TRANSDERMAL | Status: DC | PRN

## 2018-07-03 MED ORDER — CEFAZOLIN SODIUM-DEXTROSE 2-4 GM/100ML-% IV SOLN
INTRAVENOUS | Status: AC
  Filled 2018-07-03: qty 100

## 2018-07-03 MED ORDER — SUCCINYLCHOLINE CHLORIDE 200 MG/10ML IV SOSY
PREFILLED_SYRINGE | INTRAVENOUS | Status: AC
  Filled 2018-07-03: qty 10

## 2018-07-03 MED ORDER — FENTANYL CITRATE (PF) 100 MCG/2ML IJ SOLN
INTRAMUSCULAR | Status: AC
  Filled 2018-07-03: qty 2

## 2018-07-03 MED ORDER — PHENYLEPHRINE 40 MCG/ML (10ML) SYRINGE FOR IV PUSH (FOR BLOOD PRESSURE SUPPORT)
PREFILLED_SYRINGE | INTRAVENOUS | Status: AC
  Filled 2018-07-03: qty 10

## 2018-07-03 MED ORDER — BUPIVACAINE HCL (PF) 0.25 % IJ SOLN
INTRAMUSCULAR | Status: DC | PRN
  Administered 2018-07-03: 8 mL

## 2018-07-03 MED ORDER — MIDAZOLAM HCL 2 MG/2ML IJ SOLN
1.0000 mg | INTRAMUSCULAR | Status: DC | PRN
  Administered 2018-07-03: 2 mg via INTRAVENOUS

## 2018-07-03 MED ORDER — MIDAZOLAM HCL 2 MG/2ML IJ SOLN
INTRAMUSCULAR | Status: AC
  Filled 2018-07-03: qty 2

## 2018-07-03 MED ORDER — TRAMADOL HCL 50 MG PO TABS: 50.0000 mg | ORAL_TABLET | Freq: Four times a day (QID) | ORAL | 0 refills | Status: DC | PRN

## 2018-07-03 MED ORDER — LIDOCAINE HCL (PF) 0.5 % IJ SOLN
INTRAMUSCULAR | Status: DC | PRN
  Administered 2018-07-03: 35 mL via INTRAVENOUS

## 2018-07-03 MED ORDER — ONDANSETRON HCL 4 MG/2ML IJ SOLN
INTRAMUSCULAR | Status: AC
  Filled 2018-07-03: qty 2

## 2018-07-03 MED ORDER — ONDANSETRON HCL 4 MG/2ML IJ SOLN
INTRAMUSCULAR | Status: DC | PRN
  Administered 2018-07-03: 4 mg via INTRAVENOUS

## 2018-07-03 MED ORDER — FENTANYL CITRATE (PF) 100 MCG/2ML IJ SOLN
50.0000 ug | INTRAMUSCULAR | Status: DC | PRN
  Administered 2018-07-03: 100 ug via INTRAVENOUS

## 2018-07-03 MED ORDER — LACTATED RINGERS IV SOLN
INTRAVENOUS | Status: DC
  Administered 2018-07-03 (×2): via INTRAVENOUS

## 2018-07-03 MED ORDER — EPHEDRINE SULFATE 50 MG/ML IJ SOLN
INTRAMUSCULAR | Status: AC
  Filled 2018-07-03: qty 1

## 2018-07-03 MED ORDER — PROPOFOL 10 MG/ML IV BOLUS
INTRAVENOUS | Status: DC | PRN
  Administered 2018-07-03 (×4): 20 mg via INTRAVENOUS

## 2018-07-03 SURGICAL SUPPLY — 43 items
BANDAGE COBAN STERILE 2 (GAUZE/BANDAGES/DRESSINGS) ×2 IMPLANT
BLADE SURG 15 STRL LF DISP TIS (BLADE) ×1 IMPLANT
BLADE SURG 15 STRL SS (BLADE) ×1
BNDG COHESIVE 1X5 TAN STRL LF (GAUZE/BANDAGES/DRESSINGS) IMPLANT
BNDG COHESIVE 3X5 TAN STRL LF (GAUZE/BANDAGES/DRESSINGS) IMPLANT
BNDG ESMARK 4X9 LF (GAUZE/BANDAGES/DRESSINGS) IMPLANT
BNDG GAUZE ELAST 4 BULKY (GAUZE/BANDAGES/DRESSINGS) IMPLANT
CHLORAPREP W/TINT 26ML (MISCELLANEOUS) ×2 IMPLANT
CORD BIPOLAR FORCEPS 12FT (ELECTRODE) ×2 IMPLANT
COVER BACK TABLE 60X90IN (DRAPES) ×2 IMPLANT
COVER MAYO STAND STRL (DRAPES) ×2 IMPLANT
CUFF TOURNIQUET SINGLE 18IN (TOURNIQUET CUFF) ×2 IMPLANT
DECANTER SPIKE VIAL GLASS SM (MISCELLANEOUS) IMPLANT
DRAIN PENROSE 1/2X12 LTX STRL (WOUND CARE) IMPLANT
DRAPE EXTREMITY T 121X128X90 (DRAPE) ×2 IMPLANT
DRAPE SURG 17X23 STRL (DRAPES) ×2 IMPLANT
GAUZE SPONGE 4X4 12PLY STRL (GAUZE/BANDAGES/DRESSINGS) ×2 IMPLANT
GAUZE XEROFORM 1X8 LF (GAUZE/BANDAGES/DRESSINGS) ×2 IMPLANT
GLOVE BIO SURGEON STRL SZ 6.5 (GLOVE) ×2 IMPLANT
GLOVE BIOGEL PI IND STRL 7.0 (GLOVE) ×2 IMPLANT
GLOVE BIOGEL PI IND STRL 8.5 (GLOVE) ×1 IMPLANT
GLOVE BIOGEL PI INDICATOR 7.0 (GLOVE) ×2
GLOVE BIOGEL PI INDICATOR 8.5 (GLOVE) ×1
GLOVE SURG ORTHO 8.0 STRL STRW (GLOVE) ×2 IMPLANT
GOWN STRL REUS W/ TWL LRG LVL3 (GOWN DISPOSABLE) ×1 IMPLANT
GOWN STRL REUS W/TWL LRG LVL3 (GOWN DISPOSABLE) ×1
GOWN STRL REUS W/TWL XL LVL3 (GOWN DISPOSABLE) ×2 IMPLANT
NDL SAFETY ECLIPSE 18X1.5 (NEEDLE) IMPLANT
NEEDLE HYPO 18GX1.5 SHARP (NEEDLE)
NEEDLE PRECISIONGLIDE 27X1.5 (NEEDLE) ×2 IMPLANT
NS IRRIG 1000ML POUR BTL (IV SOLUTION) ×2 IMPLANT
PACK BASIN DAY SURGERY FS (CUSTOM PROCEDURE TRAY) ×2 IMPLANT
PAD CAST 3X4 CTTN HI CHSV (CAST SUPPLIES) IMPLANT
PADDING CAST COTTON 3X4 STRL (CAST SUPPLIES)
SPLINT PLASTER CAST XFAST 3X15 (CAST SUPPLIES) IMPLANT
SPLINT PLASTER XTRA FASTSET 3X (CAST SUPPLIES)
STOCKINETTE 4X48 STRL (DRAPES) ×2 IMPLANT
SUT ETHILON 4 0 PS 2 18 (SUTURE) ×2 IMPLANT
SUT VIC AB 4-0 P2 18 (SUTURE) IMPLANT
SYR BULB 3OZ (MISCELLANEOUS) ×2 IMPLANT
SYR CONTROL 10ML LL (SYRINGE) ×2 IMPLANT
TOWEL GREEN STERILE FF (TOWEL DISPOSABLE) ×2 IMPLANT
UNDERPAD 30X30 (UNDERPADS AND DIAPERS) ×2 IMPLANT

## 2018-07-03 NOTE — H&P (Signed)
  Megan Turner is an 42 y.o. female.   Chief Complaint:mass right thumbHPI:Megan Turner  is a 42 year old right-hand-dominant female comes in with a mass at the metacarpal phalangeal joint ulnar side of her right thumb. This been present for at least 2 years increasing over the past 8 months and seems to have stabilized she has no history of injury. She states causes no pain numbness or tingling. He states nothing seems to make it better or worse. She states that it is simply in the way. Is referred by Dr. Orland Mustard. No history of diabetes thyroid problems arthritis or gout. Family history is positive diabetes arthritis and gout. Negative for thyroid problems.Her ultrasound is reviewed this was read by Dr. Lorriane Shire revealing a solid hypoechoic well-defined mass in the volar ulnar aspect of the metacarpal phalangeal joint of her right thumb. 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 Ectopic pregnancy 04/2014   associated with fertility treatments  . Infertility     Past Surgical History:  Procedure Laterality Date  . CERVICAL CERCLAGE N/A 05/09/2015   Procedure: CERCLAGE CERVICAL;  Surgeon: Everett Graff, MD;  Location: [facility];  Service: Gynecology;  Laterality: N/A;  . CESAREAN SECTION N/A 06/09/2015   Procedure: CESAREAN SECTION;  Surgeon: Delsa Bern, MD;  Location: [facility];  Service: Obstetrics;  Laterality: N/A;  . ear lobe surgery      Family History  Problem Relation Age of Onset  . Hypertension Mother   . Heart disease Father   . Kidney disease Father   . Hypertension Father   . Diabetes Paternal Grandmother   . Ovarian cancer Paternal Grandmother   . Lupus Brother    Social History:  reports that she has quit smoking. Her smoking use included cigars. She has never used smokeless tobacco. She reports that she drinks alcohol. She reports that she does not use drugs.  Allergies:  Allergies   Allergen Reactions  . Dye Fdc Red [Red Dye]     CT contrast dye    No medications prior to admission.    No results found for this or any previous visit (from the past 48 hour(s)).  No results found.   Pertinent items are noted in HPI.  Height 5\' 5"  (1.651 m), weight 100.7 kg (222 lb), last menstrual period 06/18/2018, unknown if currently breastfeeding.  General appearance: alert, cooperative and appears stated age Head: Normocephalic, without obvious abnormality Neck: no JVD Resp: clear to auscultation bilaterally Cardio: regular rate and rhythm, S1, S2 normal, no murmur, click, rub or gallop GI: soft, non-tender; bowel sounds normal; no masses,  no organomegaly Extremities: mass right thumb Pulses: 2+ and symmetric Skin: Skin color, texture, turgor normal. No rashes or lesions Neurologic: Grossly normal Incision/Wound: na  Assessment/Plan Assessment:  1. Mass right thumb   Plan: We have discussed excision of this with her. Pre-peri-postoperative course are discussed along with risks and complications. She is aware there is no guarantee to the surgery the possibility of infection recurrence injury to arteries nerves tendons complete relief symptoms dystrophy possibility of stiffness possibility of numbness and tingling would like to proceed and is scheduled for excision mass right thumb as an outpatient under regional anesthesia.      Megan Turner R 07/03/2018, 5:10 AM

## 2018-07-03 NOTE — Anesthesia Procedure Notes (Signed)
Anesthesia Regional Block: Bier block (IV Regional)   Pre-Anesthetic Checklist: ,, timeout performed, Correct Patient, Correct Site, Correct Laterality, Correct Procedure,, site marked, surgical consent,, at surgeon's request  Laterality: Right  Prep: alcohol swabs       Needles:  Injection technique: Single-shot  Needle Type: Other      Needle Gauge: 20     Additional Needles:   Procedures:,,,,, intact distal pulses, Esmarch exsanguination, single tourniquet utilized,  Narrative:   Performed by: [employer]

## 2018-07-03 NOTE — Brief Op Note (Signed)
07/03/2018  11:45 AM  PATIENT:  Megan Turner  42 y.o. female  PRE-OPERATIVE DIAGNOSIS:  mass metacarpal phalangeal right thumb  POST-OPERATIVE DIAGNOSIS:  mass metacarpal phalangeal right thumb  PROCEDURE:  Procedure(s): EXCISION MASS RIGHT THUMB (Right)  SURGEON:  Surgeon(s) and Role:    Daryll Brod, MD - Primary  PHYSICIAN ASSISTANT:   ASSISTANTS: none   ANESTHESIA:   local and regional  EBL:  2 mL   BLOOD ADMINISTERED:none  DRAINS: none   LOCAL MEDICATIONS USED:  BUPIVICAINE   SPECIMEN:  Excision  DISPOSITION OF SPECIMEN:  PATHOLOGY  COUNTS:  YES  TOURNIQUET:   Total Tourniquet Time Documented: Forearm (Right) - 32 minutes Total: Forearm (Right) - 32 minutes   DICTATION: .Viviann Spare Dictation  PLAN OF CARE: Discharge to home after PACU  PATIENT DISPOSITION:  PACU - hemodynamically stable.

## 2018-07-03 NOTE — Transfer of Care (Signed)
Immediate Anesthesia Transfer of Care Note  Patient: Megan Turner  Procedure(s) Performed: EXCISION MASS RIGHT THUMB (Right Thumb)  Patient Location: PACU  Anesthesia Type:MAC and Bier block  Level of Consciousness: awake, alert  and oriented  Airway & Oxygen Therapy: Patient Spontanous Breathing and Patient connected to face mask oxygen  Post-op Assessment: Report given to RN and Post -op Vital signs reviewed and stable  Post vital signs: Reviewed and stable  Last Vitals:  Vitals Value Taken Time  BP    Temp    Pulse 60 07/03/2018 11:49 AM  Resp 17 07/03/2018 11:49 AM  SpO2 100 % 07/03/2018 11:49 AM  Vitals shown include unvalidated device data.  Last Pain: There were no vitals filed for this visit.       Complications: No apparent anesthesia complications

## 2018-07-03 NOTE — Discharge Instructions (Addendum)

## 2018-07-03 NOTE — Anesthesia Preprocedure Evaluation (Addendum)
Anesthesia Evaluation  Patient identified by MRN, date of birth, ID band Patient awake    Reviewed: Allergy & Precautions, NPO status , Patient's Chart, lab work & pertinent test results  History of Anesthesia Complications Negative for: history of anesthetic complications  Airway Mallampati: II  TM Distance: >3 FB Neck ROM: Full    Dental no notable dental hx. (+) Dental Advisory Given   Pulmonary former smoker,    Pulmonary exam normal        Cardiovascular negative cardio ROS Normal cardiovascular exam     Neuro/Psych negative neurological ROS  negative psych ROS   GI/Hepatic negative GI ROS, Neg liver ROS,   Endo/Other  Morbid obesity  Renal/GU negative Renal ROS  negative genitourinary   Musculoskeletal negative musculoskeletal ROS (+)   Abdominal   Peds negative pediatric ROS (+)  Hematology negative hematology ROS (+)   Anesthesia Other Findings   Reproductive/Obstetrics negative OB ROS                            Anesthesia Physical  Anesthesia Plan  ASA: II and emergent  Anesthesia Plan: Bier Block and MAC and Bier Block-LIDOCAINE ONLY   Post-op Pain Management:    Induction:   PONV Risk Score and Plan:   Airway Management Planned: Natural Airway  Additional Equipment:   Intra-op Plan:   Post-operative Plan:   Informed Consent: I have reviewed the patients History and Physical, chart, labs and discussed the procedure including the risks, benefits and alternatives for the proposed anesthesia with the patient or authorized representative who has indicated his/her understanding and acceptance.   Dental advisory given  Plan Discussed with: Anesthesiologist  Anesthesia Plan Comments:        Anesthesia Quick Evaluation

## 2018-07-03 NOTE — Op Note (Signed)
Preoperative diagnosis: Mass right thumb  Postoperative diagnosis: Same  Operation: Excision mass right thumb  Surgeon: Daryll Brod  Assistant: None  Anesthesiologist: Ophelia Charter of surgery: [facility]    Anesthesia: Forearm IV regional with IV sedation local infiltration  History: Patient is a 42 year old female with a history of a mass right thumb to metacarpal phalangeal joint ulnar aspect.  This been present for period of time and gradually enlarging.  Ultrasound reveals this is a solid mass.  She is desirous having this excised.  Pre-peri-and postoperative course been discussed along with risk complications.  She is aware there is no guarantee to surgery the possibility of infection recurrence injury to arteries nerves tendons incomplete relief symptoms and dystrophy.  Preoperative area the patient seen extremity marked by both patient and surgeon antibiotic given  Procedure: The patient is brought to the operating room where a forearm-based IV regional anesthetic was carried out without difficulty into the right the direction of the anesthesia department.  She was prepped using ChloraPrep in the supine position with the right arm free.  A three-minute dry time was allowed and a timeout taken to confirm patient procedure.  A volar Bruner incision was made with apex over the mass.  This carried down through subcutaneous tissue.  The neurovascular bundles were identified with the artery being on the ulnar aspect of the mass the nerve on the radial aspect both were intact and protected throughout the procedure.  A large mass was then excised with blunt sharp dissection.  This was multilobulated tanyellowish tumor measuring 1.3 x 1.6 cm.  The entire specimen was sent to pathology.  It appeared to come from the flexor tendon.  No further lesions were identified.  The wound was irrigated.  The skin was closed interrupted 4-0 nylon sutures.  Local infiltration quarter percent  bupivacaine without epinephrine was given approximately 6 cc was used.  A sterile compressive dressing was applied.  Deflation of the tourniquet all fingers immediately pink.  She was taken to the recovery room for observation in satisfactory condition.  She will be discharged home to return to [facility] in 1 week on tramadol she will take ibuprofen Motrin prior to this if she has breakthrough she will use the tramadol.

## 2018-07-04 ENCOUNTER — Encounter (HOSPITAL_BASED_OUTPATIENT_CLINIC_OR_DEPARTMENT_OTHER): Payer: Self-pay | Admitting: Orthopedic Surgery

## 2018-07-04 NOTE — Anesthesia Postprocedure Evaluation (Signed)
Anesthesia Post Note  Patient: Megan Turner  Procedure(s) Performed: EXCISION MASS RIGHT THUMB (Right Thumb)     Patient location during evaluation: PACU Anesthesia Type: MAC Level of consciousness: awake and alert Pain management: pain level controlled Vital Signs Assessment: post-procedure vital signs reviewed and stable Respiratory status: spontaneous breathing and respiratory function stable Cardiovascular status: stable Postop Assessment: no apparent nausea or vomiting Anesthetic complications: no    Last Vitals:  Vitals:   07/03/18 1200 07/03/18 1224  BP: (!) 126/92 (!) 140/97  Pulse: (!) 58 64  Resp: (!) 21 (!) 22  Temp:  36.7 C  SpO2: 100% 100%    Last Pain:  Vitals:   07/04/18 0942  PainSc: 0-No pain                 Alysah Carton DANIEL

## 2019-08-14 ENCOUNTER — Other Ambulatory Visit: Payer: Self-pay | Admitting: Obstetrics and Gynecology

## 2019-08-14 DIAGNOSIS — N6489 Other specified disorders of breast: Secondary | ICD-10-CM

## 2019-08-20 ENCOUNTER — Ambulatory Visit: Payer: BC Managed Care – PPO

## 2019-08-20 ENCOUNTER — Other Ambulatory Visit: Payer: Self-pay

## 2019-08-20 ENCOUNTER — Ambulatory Visit
Admission: RE | Admit: 2019-08-20 | Discharge: 2019-08-20 | Disposition: A | Discharge status: Home / Self Care | Payer: BC Managed Care – PPO | Source: Ambulatory Visit | Attending: Obstetrics and Gynecology | Admitting: Obstetrics and Gynecology

## 2019-08-20 DIAGNOSIS — N6489 Other specified disorders of breast: Secondary | ICD-10-CM

## 2019-10-07 ENCOUNTER — Telehealth: Payer: BC Managed Care – PPO | Admitting: Family

## 2019-10-07 DIAGNOSIS — J0110 Acute frontal sinusitis, unspecified: Secondary | ICD-10-CM | POA: Diagnosis not present

## 2019-10-07 MED ORDER — AMOXICILLIN-POT CLAVULANATE 875-125 MG PO TABS: 1.0000 | ORAL_TABLET | Freq: Two times a day (BID) | ORAL | 0 refills | Status: DC

## 2019-10-07 NOTE — Progress Notes (Signed)
We are sorry that you are not feeling well.  Here is how we plan to help!  Based on what you have shared with me it looks like you have sinusitis.  Sinusitis is inflammation and infection in the sinus cavities of the head.  Based on your presentation I believe you most likely have Acute Bacterial Sinusitis.  This is an infection caused by bacteria and is treated with antibiotics. I have prescribed Augmentin 875mg /125mg  one tablet twice daily with food, for 7 days. You may use an oral decongestant such as Mucinex D or if you have glaucoma or high blood pressure use plain Mucinex. Saline nasal spray help and can safely be used as often as needed for congestion.  If you develop worsening sinus pain, fever or notice severe headache and vision changes, or if symptoms are not better after completion of antibiotic, please schedule an appointment with a health care provider.    If you headache worsens or does not improve you need to follow up with your PCP. If you have any changes in your headache, vision, walk, or speech you need to go the the ED. Approximately 5 minutes was spent documenting and reviewing patient's chart.    Sinus infections are not as easily transmitted as other respiratory infection, however we still recommend that you avoid close contact with loved ones, especially the very young and elderly.  Remember to wash your hands thoroughly throughout the day as this is the number one way to prevent the spread of infection!  Home Care:  Only take medications as instructed by your medical team.  Complete the entire course of an antibiotic.  Do not take these medications with alcohol.  A steam or ultrasonic humidifier can help congestion.  You can place a towel over your head and breathe in the steam from hot water coming from a faucet.  Avoid close contacts especially the very young and the elderly.  Cover your mouth when you cough or sneeze.  Always remember to wash your hands.  Get  Help Right Away If:  You develop worsening fever or sinus pain.  You develop a severe head ache or visual changes.  Your symptoms persist after you have completed your treatment plan.  Make sure you  Understand these instructions.  Will watch your condition.  Will get help right away if you are not doing well or get worse.  Your e-visit answers were reviewed by a board certified advanced clinical practitioner to complete your personal care plan.  Depending on the condition, your plan could have included both over the counter or prescription medications.  If there is a problem please reply  once you have received a response from your provider.  Your safety is important to [place].  If you have drug allergies check your prescription carefully.    You can use MyChart to ask questions about today's visit, request a non-urgent call back, or ask for a work or school excuse for 24 hours related to this e-Visit. If it has been greater than 24 hours you will need to follow up with your provider, or enter a new e-Visit to address those concerns.  You will get an e-mail in the next two days asking about your experience.  I hope that your e-visit has been valuable and will speed your recovery. Thank you for using e-visits.

## 2021-04-22 ENCOUNTER — Ambulatory Visit: Payer: BC Managed Care – PPO | Admitting: Internal Medicine

## 2021-04-28 ENCOUNTER — Other Ambulatory Visit: Payer: Self-pay

## 2021-04-28 ENCOUNTER — Ambulatory Visit: Payer: BC Managed Care – PPO | Admitting: Infectious Disease

## 2021-04-28 ENCOUNTER — Encounter: Payer: Self-pay | Admitting: Infectious Disease

## 2021-04-28 VITALS — BP 132/90 | HR 71 | Wt 228.0 lb

## 2021-04-28 DIAGNOSIS — N760 Acute vaginitis: Secondary | ICD-10-CM

## 2021-04-28 DIAGNOSIS — Z98891 History of uterine scar from previous surgery: Secondary | ICD-10-CM | POA: Diagnosis not present

## 2021-04-28 DIAGNOSIS — D509 Iron deficiency anemia, unspecified: Secondary | ICD-10-CM

## 2021-04-28 DIAGNOSIS — B9689 Other specified bacterial agents as the cause of diseases classified elsewhere: Secondary | ICD-10-CM

## 2021-04-28 DIAGNOSIS — D508 Other iron deficiency anemias: Secondary | ICD-10-CM

## 2021-04-28 DIAGNOSIS — A6000 Herpesviral infection of urogenital system, unspecified: Secondary | ICD-10-CM

## 2021-04-28 DIAGNOSIS — E278 Other specified disorders of adrenal gland: Secondary | ICD-10-CM | POA: Insufficient documentation

## 2021-04-28 HISTORY — DX: Herpesviral infection of urogenital system, unspecified: A60.00

## 2021-04-28 HISTORY — DX: Iron deficiency anemia, unspecified: D50.9

## 2021-04-28 MED ORDER — FAMCICLOVIR 250 MG PO TABS: 500.0000 mg | ORAL_TABLET | Freq: Two times a day (BID) | ORAL | 11 refills | Status: DC

## 2021-04-28 NOTE — Progress Notes (Signed)
Reason for infectious disease consult: Apparent recurrent genital herpes despite prophylaxis with various antiviral drugs including acyclovir valacyclovir and famciclovir  Requesting Physician: Earnstine Regal, PA  Subjective:    Patient ID: Megan Turner, female    DOB: [birth year], 45 y.o.   MRN: [id number]  HPI   Megan Turner is 45 year old Black woman who has past medical history significant for ovarian cysts hypertension, obesity, problems with miscarriages and has had in vitro fertilization.  She first began having outbreaks of what is thought to be herpes when she was a teenager.  They were not happening that frequently.  More more recently after she went on hormonal therapy for IVF she has noticed that she has outbreaks of what is thought to be herpes on nearly a monthly basis.  This is been happening despite prophylactic treatment with various pro drugs of acyclovir including famciclovir valacyclovir and also generic acyclovir.  She since feels that in the last 2 months since she started taking Valtrex 1 g not just once daily but 1 g once in the morning followed by half a tablet at night she has had less outbreaks.  She has quite a few questions about whether these outbreaks could be related to her iron and her monthly cycle, to an adrenal cyst.  She also notices that she tends to have outbreaks if she masturbates.  She curiously feels that when she has sex with her husband that sometimes that active having sex with him seems to soothe the herpetic lesions.  She is not known to be immunodeficient.  She has had an HIV test but 2 years ago.  She has no history of STIs.  She believes her husband is unfaithful to her does not think she is likely at risk for STIs or HIV but is agreeable to getting an HIV test today.    Past Medical History:  Diagnosis Date  . Ectopic pregnancy 04/2014   associated with fertility treatments  . Genital herpes 04/28/2021  . Infertility     Past  Surgical History:  Procedure Laterality Date  . CERVICAL CERCLAGE N/A 05/09/2015   Procedure: CERCLAGE CERVICAL;  Surgeon: Everett Graff, MD;  Location: [facility];  Service: Gynecology;  Laterality: N/A;  . CESAREAN SECTION N/A 06/09/2015   Procedure: CESAREAN SECTION;  Surgeon: Delsa Bern, MD;  Location: [facility];  Service: Obstetrics;  Laterality: N/A;  . ear lobe surgery    . MASS EXCISION Right 07/03/2018   Procedure: EXCISION MASS RIGHT THUMB;  Surgeon: Daryll Brod, MD;  Location: [facility];  Service: Orthopedics;  Laterality: Right;    Family History  Problem Relation Age of Onset  . Hypertension Mother   . Heart disease Father   . Kidney disease Father   . Hypertension Father   . Diabetes Paternal Grandmother   . Ovarian cancer Paternal Grandmother   . Lupus Brother       Social History   Socioeconomic History  . Marital status: Married    Spouse name: Not on file  . Number of children: Not on file  . Years of education: Not on file  . Highest education level: Not on file  Occupational History  . Not on file  Tobacco Use  . Smoking status: Former Smoker    Types: Cigars  . Smokeless tobacco: Never Used  Substance and Sexual Activity  . Alcohol use: Yes    Comment: rare  . Drug use: No  . Sexual activity: Yes  Birth control/protection: None  Other Topics Concern  . Not on file  Social History Narrative  . Not on file   Social Determinants of Health   Financial Resource Strain: Not on file  Food Insecurity: Not on file  Transportation Needs: Not on file  Physical Activity: Not on file  Stress: Not on file  Social Connections: Not on file    Allergies  Allergen Reactions  . Dye Fdc Red [Red Dye]     CT contrast dye     Current Outpatient Medications:  .  acyclovir (ZOVIRAX) 200 MG capsule, Take 1,000 mg by mouth 5 (five) times daily., Disp: , Rfl:  .  omeprazole (PRILOSEC) 40 MG capsule, Take 40 mg by mouth daily., Disp: , Rfl:  .   Prenatal Vit-Fe Fumarate-FA (PRENATAL MULTIVITAMIN) TABS tablet, Take 1 tablet by mouth daily at 12 noon., Disp: , Rfl:  .  Vitamin D, Ergocalciferol, (DRISDOL) 50000 units CAPS capsule, Take 50,000 Units by mouth every 7 (seven) days., Disp: , Rfl:  .  amoxicillin-clavulanate (AUGMENTIN) 875-125 MG tablet, Take 1 tablet by mouth 2 (two) times daily. (Patient not taking: Reported on 04/28/2021), Disp: 14 tablet, Rfl: 0 .  docusate sodium (COLACE) 100 MG capsule, Take 100 mg by mouth 2 (two) times daily as needed for mild constipation. (Patient not taking: Reported on 04/28/2021), Disp: , Rfl:  .  ketoconazole (NIZORAL) 2 % cream, SMARTSIG:Sparingly Topical Twice Daily, Disp: , Rfl:  .  omeprazole (PRILOSEC) 20 MG capsule, Take 1 capsule by mouth daily., Disp: , Rfl:  .  traMADol (ULTRAM) 50 MG tablet, Take 1 tablet (50 mg total) by mouth every 6 (six) hours as needed. (Patient not taking: Reported on 04/28/2021), Disp: 20 tablet, Rfl: 0    Review of Systems  Constitutional: Negative for activity change, appetite change, chills, diaphoresis, fatigue, fever and unexpected weight change.  HENT: Negative for congestion, rhinorrhea, sinus pressure, sneezing, sore throat and trouble swallowing.   Eyes: Negative for photophobia and visual disturbance.  Respiratory: Negative for cough, chest tightness, shortness of breath, wheezing and stridor.   Cardiovascular: Negative for chest pain, palpitations and leg swelling.  Gastrointestinal: Negative for abdominal distention, abdominal pain, anal bleeding, blood in stool, constipation, diarrhea, nausea and vomiting.  Genitourinary: Positive for genital sores. Negative for difficulty urinating, dysuria, flank pain and hematuria.  Musculoskeletal: Negative for arthralgias, back pain, gait problem, joint swelling and myalgias.  Skin: Negative for color change, pallor, rash and wound.  Neurological: Negative for dizziness, tremors, weakness and light-headedness.   Hematological: Negative for adenopathy. Does not bruise/bleed easily.  Psychiatric/Behavioral: Negative for agitation, behavioral problems, confusion, decreased concentration, dysphoric mood and sleep disturbance.       Objective:   Physical Exam Constitutional:      General: She is not in acute distress.    Appearance: She is not diaphoretic.  HENT:     Head: Normocephalic and atraumatic.     Right Ear: External ear normal.     Left Ear: External ear normal.     Nose: Nose normal.     Mouth/Throat:     Pharynx: No oropharyngeal exudate.  Eyes:     General: No scleral icterus.    Conjunctiva/sclera: Conjunctivae normal.     Pupils: Pupils are equal, round, and reactive to light.  Cardiovascular:     Rate and Rhythm: Normal rate and regular rhythm.  Pulmonary:     Effort: Pulmonary effort is normal. No respiratory distress.     Breath  sounds: Normal breath sounds. No wheezing or rales.  Abdominal:     General: There is no distension.     Palpations: Abdomen is soft.  Musculoskeletal:        General: No tenderness. Normal range of motion.     Cervical back: Normal range of motion and neck supple.  Lymphadenopathy:     Cervical: No cervical adenopathy.  Skin:    General: Skin is warm and dry.     Coloration: Skin is not pale.     Findings: No erythema or rash.  Neurological:     General: No focal deficit present.     Mental Status: She is alert and oriented to person, place, and time.     Coordination: Coordination normal.  Psychiatric:        Mood and Affect: Mood normal.        Behavior: Behavior normal.        Thought Content: Thought content normal.        Judgment: Judgment normal.           Assessment & Plan:   Apparent recurrent HSV outbreaks: We will try famciclovir 500 mg twice daily and see if this may keep this under Turner control.  She has another outbreak of asked her to come to the clinic to see myself or one of my other fellow providers.  We  could then send an HSV PCR from lesions (NOTE WE NEED TO MAKE SURE THE LAB ACTUALLY SENDS THE PCR BECAUSE OUR LAB HAS BEEN CHANGING PCR ORDERS TO VIRAL CULTURE ORDERS WHICH ARE FAR LESS SENSITIVE)  It would seem strange for an immunocompetent adult to harbor significant acyclovir resistant virus.  Perhaps that we could send off the virus for resistance testing as well.  I will try to get in touch with Omelia Blackwater re other thoughts.  I also wonder if it could be something else such contact dermatitis.  HIV screening we will screen her for HIV.   I spent greater than 60 minutes with the patient including greater than 50% of time in face to face counsel of the patient re her possible HSV recurrences, her IVF, adrenal nodule, her iron deficiency anemia, review of her records and and in coordination of hercare.

## 2021-04-29 LAB — HIV ANTIBODY (ROUTINE TESTING W REFLEX): HIV 1&2 Ab, 4th Generation: NONREACTIVE

## 2021-06-30 ENCOUNTER — Encounter: Payer: Self-pay | Admitting: Infectious Disease

## 2021-06-30 ENCOUNTER — Telehealth (INDEPENDENT_AMBULATORY_CARE_PROVIDER_SITE_OTHER): Payer: BC Managed Care – PPO | Admitting: Infectious Disease

## 2021-06-30 ENCOUNTER — Other Ambulatory Visit: Payer: Self-pay

## 2021-06-30 DIAGNOSIS — N760 Acute vaginitis: Secondary | ICD-10-CM

## 2021-06-30 DIAGNOSIS — A6000 Herpesviral infection of urogenital system, unspecified: Secondary | ICD-10-CM | POA: Diagnosis not present

## 2021-06-30 DIAGNOSIS — B009 Herpesviral infection, unspecified: Secondary | ICD-10-CM | POA: Diagnosis not present

## 2021-06-30 DIAGNOSIS — U071 COVID-19: Secondary | ICD-10-CM | POA: Diagnosis not present

## 2021-06-30 HISTORY — DX: COVID-19: U07.1

## 2021-06-30 HISTORY — DX: Herpesviral infection, unspecified: B00.9

## 2021-06-30 MED ORDER — PAXLOVID 20 X 150 MG & 10 X 100MG PO TBPK: 1.0000 | ORAL_TABLET | Freq: Two times a day (BID) | ORAL | 0 refills | Status: AC

## 2021-06-30 NOTE — Progress Notes (Signed)
Virtual Visit via Video Note  I connected with Maxie Better on 06/30/21 at  4:00 PM EDT by a video enabled telemedicine application and verified that I am speaking with the correct person using two identifiers.  Location: Patient: Parked car Provider: [facility]   I discussed the limitations of evaluation and management by telemedicine and the availability of in person appointments. The patient expressed understanding and agreed to proceed.  History of Present Illness:  45 year old black woman with history of ovarian cysts obesity problems with miscarriages in vitro fertilization who I saw last visit in May for what has been thought by other providers to be recurrent herpes infection.  While outbreak she had as a teenager when she had a positive viral culture and another outbreak which occurred her buttocks sound consistent with HSV infection or recurrent symptoms of vaginal and irritation as well as irritation around her labia as being the only location that presumed HSV is recurring and this happening despite highly active antiviral medications having been used makes me question this diagnosis.  I did place her on high-dose famciclovir 500 mg twice daily with plans to examine any outbreaks that come up.  Since we started famciclovir in May she has had dramatic improvement and says that she has not had any of the typical symptoms that she has had with what is presumed to be HSV infection.  She was scheduled see me in person today but had traveled to a music festival in [place] with her husband.  Others at the festival had tested positive and she had upper respiratory tract symptoms and therefore the visit was changed to a virtual visit.  She did herself in fact test positive for COVID-19 since she change the visit to a virtual 1.  She was in her car driving to pick up her dog at daycare when she was contacted by video feed but she parked the car when we initiated the visit.  She has had 3  days of symptoms with sore throat and now dry cough and some congestion    Observations/Objective:  She seem to be stable was coughing a dry cough during some of the interview but otherwise seem to be doing relatively well  Assessment and Plan:  COVID-19 infection: She has been vaccinated with 2 doses and received booster vaccination as well.  I have called in Paxlovid x 5days.  Have counseled her about potential side effects and also the potential for viral rebound.  I have recommended that she retest her self with a home base antigen test when she is considering coming out of home isolation her husband also has symptoms and likely has COVID-19 infection but has not yet been tested.  Possible recurrent HSV: See discussion above continue famciclovir for now but may need to revisit this diagnosis by exam in person. I wonder if she has something else causing the symptoms within her vaginal canal and labia and not HSV.  This would make much more sense to me in the sense that at times certain antivirals seem to have an effect on the recurrence of the symptoms and other times them seem to have no effect which makes me think that the apparent positive effect of the antivirals is really a presumed cause-and-effect and rather just a random correlation and the fact that they have not worked at times would point towards that this not being the diagnosis.  She is not immunocompromised and should not harbor significant acyclovir resistant virus.  I spent more  than 30 minutes with the patient including  in face to face counseling of the patient personally reviewing radiographs, long with pertinent laboratory microbiological data review of medical records and in coordination of her care.   Follow Up Instructions:    I discussed the assessment and treatment plan with the patient. The patient was provided an opportunity to ask questions and all were answered. The patient agreed with the plan and demonstrated an  understanding of the instructions.   The patient was advised to call back or seek an in-person evaluation if the symptoms worsen or if the condition fails to improve as anticipated.     Alcide Evener, MD

## 2021-08-06 ENCOUNTER — Ambulatory Visit
Admission: RE | Admit: 2021-08-06 | Discharge: 2021-08-06 | Disposition: A | Discharge status: Home / Self Care | Payer: BC Managed Care – PPO | Source: Ambulatory Visit | Attending: Family Medicine | Admitting: Family Medicine

## 2021-08-06 ENCOUNTER — Other Ambulatory Visit: Payer: Self-pay | Admitting: Family Medicine

## 2021-08-06 DIAGNOSIS — R079 Chest pain, unspecified: Secondary | ICD-10-CM

## 2021-08-23 ENCOUNTER — Other Ambulatory Visit: Payer: Self-pay | Admitting: Family Medicine

## 2021-08-23 DIAGNOSIS — R079 Chest pain, unspecified: Secondary | ICD-10-CM

## 2021-09-14 ENCOUNTER — Telehealth: Payer: Self-pay

## 2021-09-14 NOTE — Telephone Encounter (Signed)
Made multiple attempts to reach patient regarding her CT scan with Contrast at [facility] on 09/15/21 and to discuss her listed allergy to the CT contrast. Called pt and left at least 2 voicemail's at [phone] (number in epic). I attempted her work number that is provided but it would always ring busy. Called and spoke to the patients spouse, Simona Huh, morning of 09/14/21 and he stated he would have her call me back. However, I still have not heard from the patient. Therefore, a 13 hr prep has not been called in for this patient and her scan on 09/15/21. The CT staff at [facility] have been notified of this and they will contact the ordering provider.

## 2021-09-15 ENCOUNTER — Inpatient Hospital Stay: Admission: RE | Admit: 2021-09-15 | Payer: BC Managed Care – PPO | Source: Ambulatory Visit

## 2021-09-15 ENCOUNTER — Telehealth: Payer: Self-pay

## 2021-09-15 MED ORDER — DIPHENHYDRAMINE HCL 50 MG PO TABS: 50.0000 mg | ORAL_TABLET | Freq: Once | ORAL | 0 refills | Status: DC

## 2021-09-15 MED ORDER — PREDNISONE 50 MG PO TABS: ORAL_TABLET | ORAL | 0 refills | Status: DC

## 2021-09-15 NOTE — Telephone Encounter (Signed)
Phone call to patient to review instructions for 13 hr prep for CT w/ contrast on 09/30/21 at 4:00 PM. Prescription called into [facility]. Pt aware and verbalized understanding of instructions. Prescription: Pt to take 50 mg of prednisone on 09/30/21 at 3:00 AM, 50 mg of prednisone on 09/30/21 at 9:00 AM, and 50 mg of prednisone on 09/30/21 at 3:00 PM. Pt is also to take 50 mg of benadryl on 09/30/21 at 3:00 PM. Please call [phone] with any questions.

## 2021-09-29 ENCOUNTER — Encounter: Payer: Self-pay | Admitting: Infectious Disease

## 2021-09-29 ENCOUNTER — Other Ambulatory Visit: Payer: Self-pay

## 2021-09-29 ENCOUNTER — Ambulatory Visit (INDEPENDENT_AMBULATORY_CARE_PROVIDER_SITE_OTHER): Payer: BC Managed Care – PPO | Admitting: Infectious Disease

## 2021-09-29 VITALS — BP 125/87 | HR 87 | Resp 16 | Ht 66.0 in | Wt 225.7 lb

## 2021-09-29 DIAGNOSIS — U071 COVID-19: Secondary | ICD-10-CM

## 2021-09-29 DIAGNOSIS — A6000 Herpesviral infection of urogenital system, unspecified: Secondary | ICD-10-CM | POA: Diagnosis not present

## 2021-09-29 DIAGNOSIS — Z23 Encounter for immunization: Secondary | ICD-10-CM | POA: Diagnosis not present

## 2021-09-29 DIAGNOSIS — N760 Acute vaginitis: Secondary | ICD-10-CM

## 2021-09-29 DIAGNOSIS — B009 Herpesviral infection, unspecified: Secondary | ICD-10-CM

## 2021-09-29 NOTE — Progress Notes (Signed)
Subjective:  Complaint follow-up for recurrent vaginitis and vulvar vulvovaginal irritation that is previously been thought to be HSV infection   Patient ID: Megan Turner, female    DOB: 10/23/1976, 45 y.o.   MRN: [id number]  HPI  45 year old black woman with history of ovarian cysts obesity problems with miscarriages in vitro fertilization who I saw last visit in May for what has been thought by other providers to be recurrent herpes infection.  While outbreak she had as a teenager when she had a positive viral culture and another outbreak which occurred her buttocks sound consistent with HSV infection or recurrent symptoms of vaginal and irritation as well as irritation around her labia as being the only location that presumed HSV is recurring and this happening despite highly active antiviral medications having been used makes me question this diagnosis.  I did place her on high-dose famciclovir 500 mg twice daily with plans to examine any outbreaks that come up.  Since we started famciclovir in May she has had dramatic improvement and says that she has not had any of the typical symptoms that she has had with what is presumed to be HSV infection.  She was scheduled see me in person in July but had traveled to a music festival in [place] with her husband.  Others at the festival had tested positive and she had upper respiratory tract symptoms and therefore the visit was changed to a virtual visit.  She did herself in fact test positive for COVID-19 since she change the visit to a virtual .  I called in Paxlovid which she obtained but she never actually took it because she had concerns about potential rebound viremia and also about tolerating the medication.  Her husband actually also contracted COVID-19 unfortunately had a myocardial infarction that was discovered along with obstructive coronary artery disease.    She has not had any further symptoms or "outbreaks."  Action  wondering as I mentioned my prior note whether not her "outbreaks" are really something more like recurrent yeast infections. Her husband also is on Valtrex and has symptoms of pruritus at times as well when he is had his own "outbreaks".  He is not circumcised and certainly he could be having candidal balanitis when he is having symptoms and that are being misdiagnosed as HSV infection.     Past Medical History:  Diagnosis Date   COVID-19 virus infection 06/30/2021   Ectopic pregnancy 04/2014   associated with fertility treatments   Genital herpes 04/28/2021   Infertility    Iron deficiency anemia 04/28/2021   Recurrent HSV (herpes simplex virus) 06/30/2021    Past Surgical History:  Procedure Laterality Date   CERVICAL CERCLAGE N/A 05/09/2015   Procedure: CERCLAGE CERVICAL;  Surgeon: Everett Graff, MD;  Location: [facility];  Service: Gynecology;  Laterality: N/A;   CESAREAN SECTION N/A 06/09/2015   Procedure: CESAREAN SECTION;  Surgeon: Delsa Bern, MD;  Location: [facility];  Service: Obstetrics;  Laterality: N/A;   ear lobe surgery     MASS EXCISION Right 07/03/2018   Procedure: EXCISION MASS RIGHT THUMB;  Surgeon: Daryll Brod, MD;  Location: [facility];  Service: Orthopedics;  Laterality: Right;    Family History  Problem Relation Age of Onset   Hypertension Mother    Heart disease Father    Kidney disease Father    Hypertension Father    Diabetes Paternal Grandmother    Ovarian cancer Paternal Grandmother    Lupus Brother  Social History   Socioeconomic History   Marital status: Married    Spouse name: Not on file   Number of children: Not on file   Years of education: Not on file   Highest education level: Not on file  Occupational History   Not on file  Tobacco Use   Smoking status: Former    Types: Cigars   Smokeless tobacco: Never  Substance and Sexual Activity   Alcohol use: Yes    Comment: rare   Drug use: No   Sexual activity: Yes    Birth  control/protection: None  Other Topics Concern   Not on file  Social History Narrative   Not on file   Social Determinants of Health   Financial Resource Strain: Not on file  Food Insecurity: Not on file  Transportation Needs: Not on file  Physical Activity: Not on file  Stress: Not on file  Social Connections: Not on file    Allergies  Allergen Reactions   Dye Fdc Red [Red Dye]    Iodinated Diagnostic Agents Itching and Rash    Pt reported itching and rash from CT scan in 2017. Pt reports this occurred once she got home. 09/15/21     Current Outpatient Medications:    famciclovir (FAMVIR) 250 MG tablet, Take by mouth 2 (two) times daily., Disp: , Rfl:    ketoconazole (NIZORAL) 2 % cream, SMARTSIG:Sparingly Topical Twice Daily, Disp: , Rfl:    omeprazole (PRILOSEC) 40 MG capsule, Take 40 mg by mouth daily., Disp: , Rfl:    Prenatal Vit-Fe Fumarate-FA (PRENATAL MULTIVITAMIN) TABS tablet, Take 1 tablet by mouth daily at 12 noon., Disp: , Rfl:    valACYclovir (VALTREX) 1000 MG tablet, Take by mouth., Disp: , Rfl:    Vitamin D, Ergocalciferol, (DRISDOL) 50000 units CAPS capsule, Take 50,000 Units by mouth every 7 (seven) days., Disp: , Rfl:    Review of Systems  Constitutional:  Negative for activity change, appetite change, chills, diaphoresis, fatigue, fever and unexpected weight change.  HENT:  Negative for congestion, rhinorrhea, sinus pressure, sneezing, sore throat and trouble swallowing.   Eyes:  Negative for photophobia and visual disturbance.  Respiratory:  Negative for cough, chest tightness, shortness of breath, wheezing and stridor.   Cardiovascular:  Negative for chest pain, palpitations and leg swelling.  Gastrointestinal:  Negative for abdominal distention, abdominal pain, anal bleeding, blood in stool, constipation, diarrhea, nausea and vomiting.  Genitourinary:  Negative for difficulty urinating, dysuria, flank pain and hematuria.  Musculoskeletal:  Negative for  arthralgias, back pain, gait problem, joint swelling and myalgias.  Skin:  Negative for color change, pallor, rash and wound.  Neurological:  Negative for dizziness, tremors, weakness and light-headedness.  Hematological:  Negative for adenopathy. Does not bruise/bleed easily.  Psychiatric/Behavioral:  Negative for agitation, behavioral problems, confusion, decreased concentration, dysphoric mood and sleep disturbance.       Objective:   Physical Exam Constitutional:      General: She is not in acute distress.    Appearance: Normal appearance. She is well-developed. She is not ill-appearing or diaphoretic.  HENT:     Head: Normocephalic and atraumatic.     Right Ear: Hearing and external ear normal.     Left Ear: Hearing and external ear normal.     Nose: No nasal deformity or rhinorrhea.  Eyes:     General: No scleral icterus.    Conjunctiva/sclera: Conjunctivae normal.     Right eye: Right conjunctiva is not injected.  Left eye: Left conjunctiva is not injected.     Pupils: Pupils are equal, round, and reactive to light.  Neck:     Vascular: No JVD.  Cardiovascular:     Rate and Rhythm: Normal rate and regular rhythm.     Heart sounds: S1 normal and S2 normal.  Pulmonary:     Effort: Pulmonary effort is normal. No respiratory distress.     Breath sounds: No wheezing.  Abdominal:     General: There is no distension.     Palpations: Abdomen is soft.  Musculoskeletal:        General: Normal range of motion.     Right shoulder: Normal.     Left shoulder: Normal.     Cervical back: Normal range of motion and neck supple.     Right hip: Normal.     Left hip: Normal.     Right knee: Normal.     Left knee: Normal.  Lymphadenopathy:     Head:     Right side of head: No submandibular, preauricular or posterior auricular adenopathy.     Left side of head: No submandibular, preauricular or posterior auricular adenopathy.     Cervical: No cervical adenopathy.     Right  cervical: No superficial or deep cervical adenopathy.    Left cervical: No superficial or deep cervical adenopathy.  Skin:    General: Skin is warm and dry.     Coloration: Skin is not pale.     Findings: No abrasion, bruising, ecchymosis, erythema, lesion or rash.     Nails: There is no clubbing.  Neurological:     General: No focal deficit present.     Mental Status: She is alert and oriented to person, place, and time.     Sensory: No sensory deficit.     Coordination: Coordination normal.     Gait: Gait normal.  Psychiatric:        Attention and Perception: She is attentive.        Mood and Affect: Mood normal.        Speech: Speech normal.        Behavior: Behavior normal. Behavior is cooperative.        Thought Content: Thought content normal.        Judgment: Judgment normal.          Assessment & Plan:   Recurrent vulvar irritation and vaginitis:  As mentioned before I think this is not likely to be due to HSV infection.  The next time she has symptoms that are concerning for "an outbreak I would like to know about it immediately have possible evaluate her in person though I do not frequently perform pelvic exams even if I could not see her it would be instructive to try for example an empiric regimen of fluconazole for yeast infection instead of treating it with a acyclovir prodrug.  COVID-19 she is recovered she should get updated bivalent vaccine (we were out at end of clinic today)  Vaccine and sling: We recommended and she agreed to getting vaccinated against influenza.

## 2021-09-30 ENCOUNTER — Ambulatory Visit
Admission: RE | Admit: 2021-09-30 | Discharge: 2021-09-30 | Disposition: A | Discharge status: Home / Self Care | Payer: BC Managed Care – PPO | Source: Ambulatory Visit | Attending: Family Medicine | Admitting: Family Medicine

## 2021-09-30 DIAGNOSIS — R079 Chest pain, unspecified: Secondary | ICD-10-CM

## 2021-09-30 MED ORDER — PREDNISONE 50 MG PO TABS: 50.0000 mg | ORAL_TABLET | Freq: Four times a day (QID) | ORAL | Status: DC

## 2021-09-30 MED ORDER — DIPHENHYDRAMINE HCL 50 MG/ML IJ SOLN: 50.0000 mg | Freq: Once | INTRAMUSCULAR | Status: DC

## 2021-09-30 MED ORDER — IOPAMIDOL (ISOVUE-370) INJECTION 76%
75.0000 mL | Freq: Once | INTRAVENOUS | Status: AC | PRN
  Administered 2021-09-30: 75 mL via INTRAVENOUS

## 2021-09-30 MED ORDER — DIPHENHYDRAMINE HCL 50 MG PO CAPS: 50.0000 mg | ORAL_CAPSULE | Freq: Once | ORAL | Status: DC

## 2021-12-07 ENCOUNTER — Other Ambulatory Visit: Payer: Self-pay | Admitting: Obstetrics and Gynecology

## 2022-01-12 ENCOUNTER — Other Ambulatory Visit: Payer: Self-pay | Admitting: Obstetrics and Gynecology

## 2022-04-18 ENCOUNTER — Telehealth: Payer: Self-pay

## 2022-04-18 ENCOUNTER — Encounter: Payer: Self-pay | Admitting: Infectious Disease

## 2022-04-18 ENCOUNTER — Other Ambulatory Visit: Payer: Self-pay

## 2022-04-18 MED ORDER — FAMCICLOVIR 250 MG PO TABS: 250.0000 mg | ORAL_TABLET | Freq: Two times a day (BID) | ORAL | 2 refills | Status: DC

## 2022-04-18 NOTE — Telephone Encounter (Signed)
Patient called requesting refill on famciclovir. Please advise if okay to refill. Patient requesting medication to be sent to [facility] on [address]  ? ? ? ?

## 2022-04-30 IMAGING — CT CT CHEST W/ CM
1 series · 15 of 34 positions shown, 19 images · IV contrast (APPLIED)
Comparison: None.

CLINICAL DATA: Substernal chest pain, palpable mass

EXAM:
CT CHEST WITH CONTRAST
TECHNIQUE: Multidetector CT imaging of the chest was performed during
intravenous contrast administration.
CONTRAST:  75mL [id number] IOPAMIDOL (21LQ9T-I7Q) INJECTION 76%

[Series 3: chest w/cm · axial · 0.69mm/px · z∈[-336,-72]mm · 15 of 156 slices shown, 19 images]
[im 12/156  mediastinal]
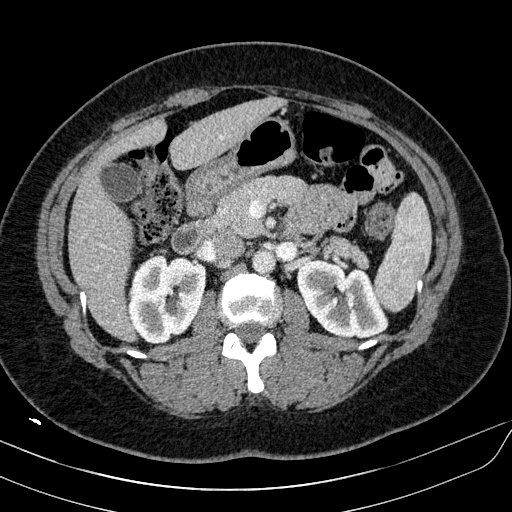
[im 12/156  lung]
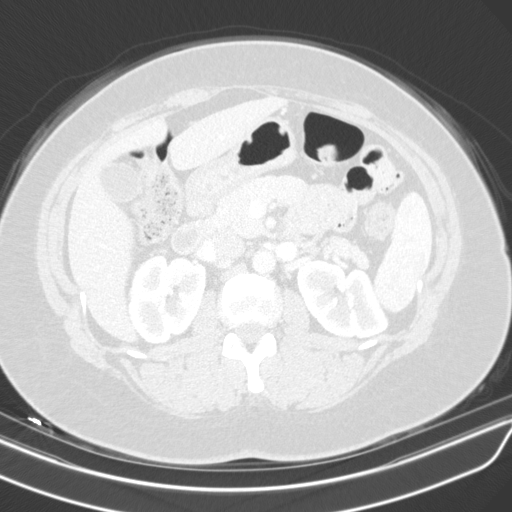
[im 23/156  lung]
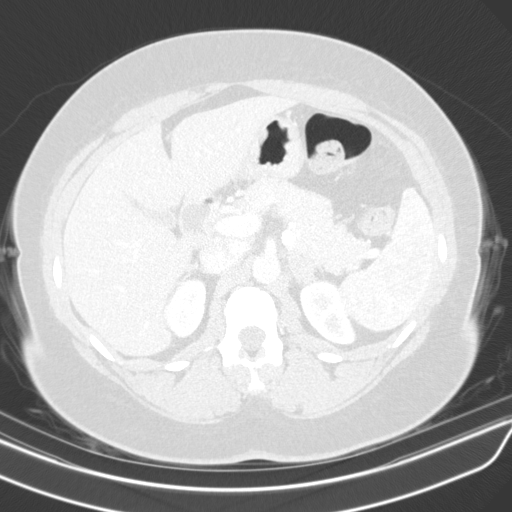
[im 32/156  lung]
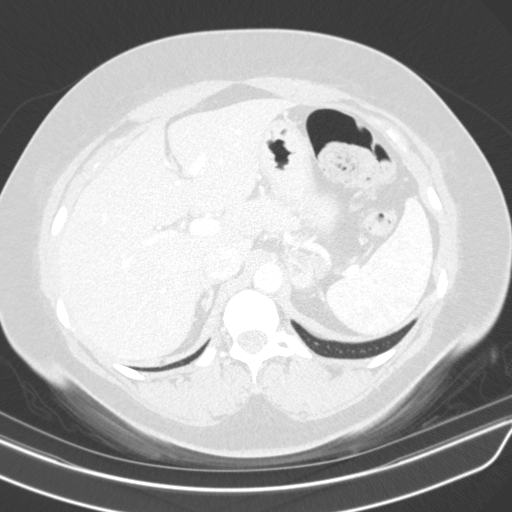
[im 41/156  lung]
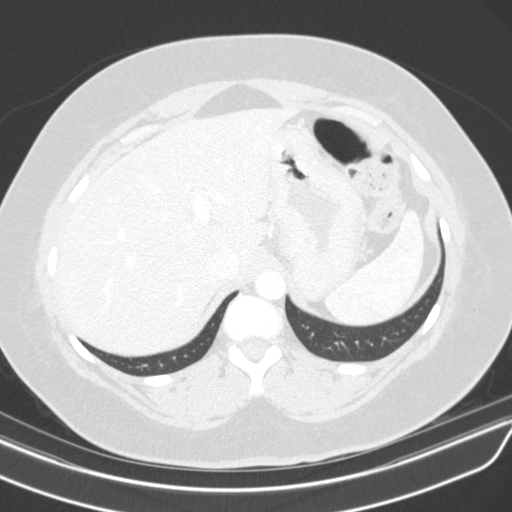
[im 52/156  mediastinal]
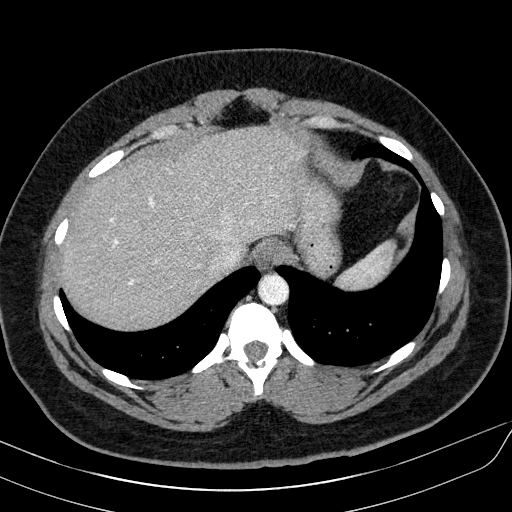
[im 52/156  lung]
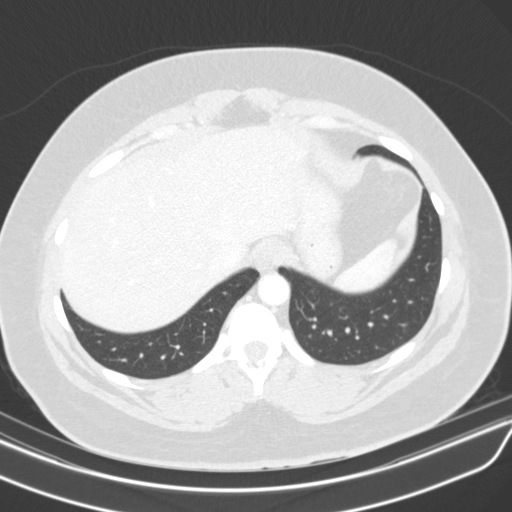
[im 63/156  lung]
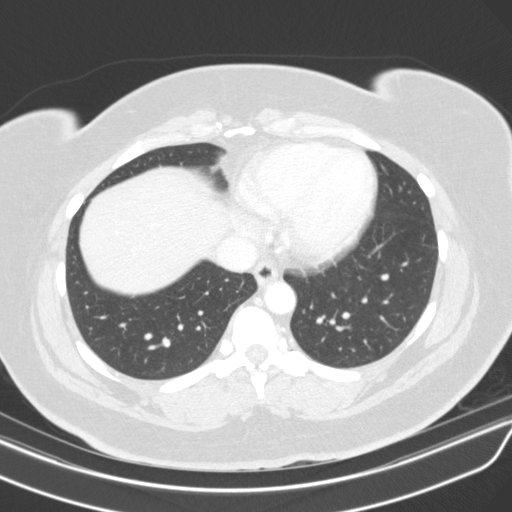
[im 69/156  lung]
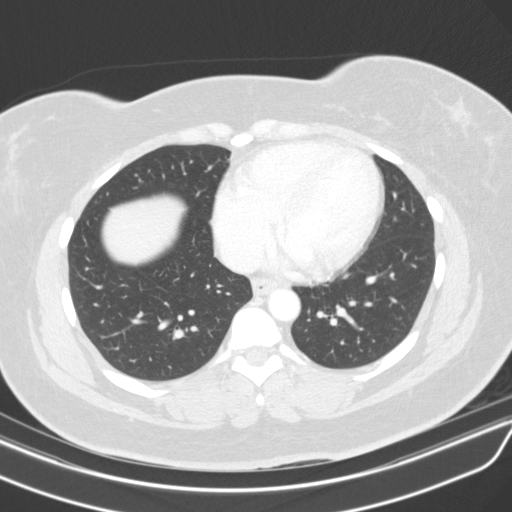
[im 81/156  lung]
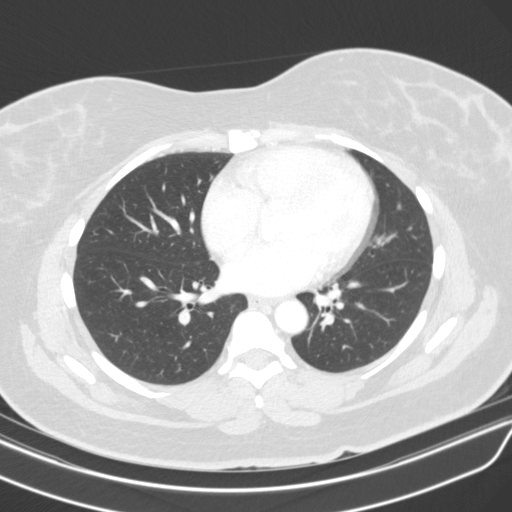
[im 87/156  mediastinal]
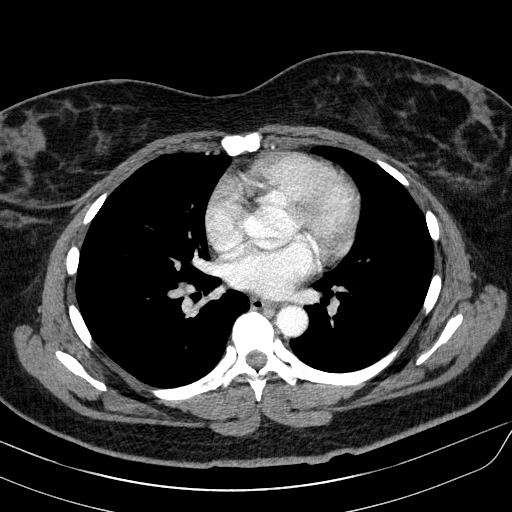
[im 87/156  lung]
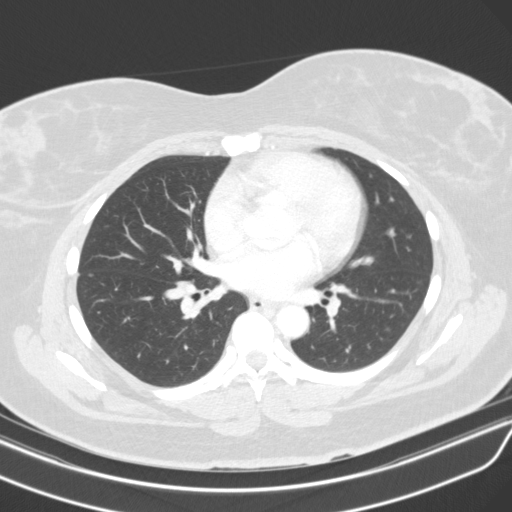
[im 94/156  lung]
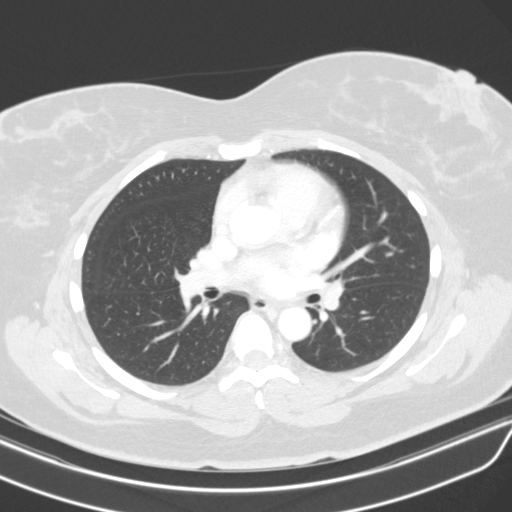
[im 104/156  lung]
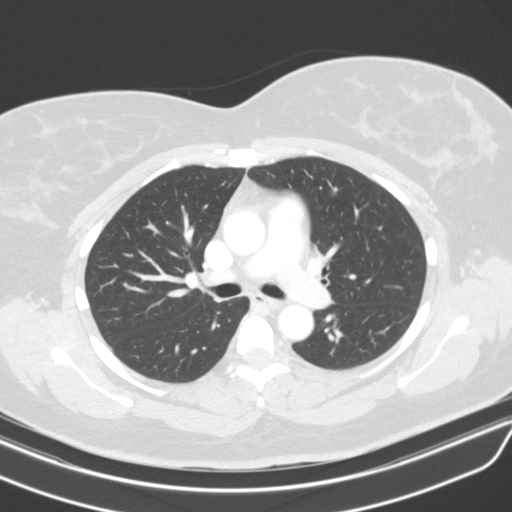
[im 115/156  lung]
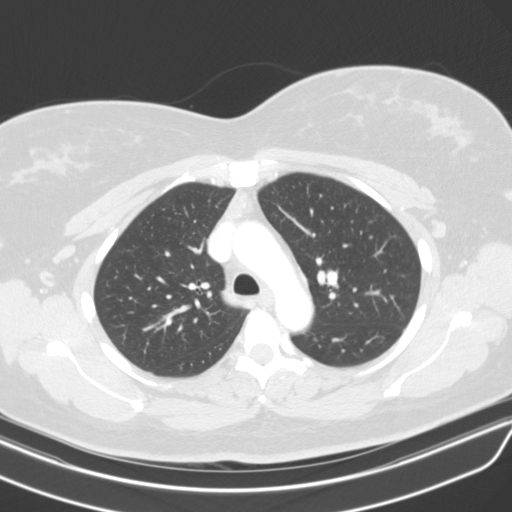
[im 125/156  mediastinal]
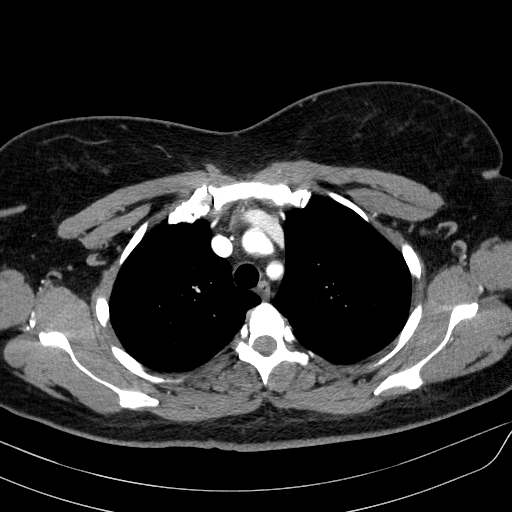
[im 125/156  lung]
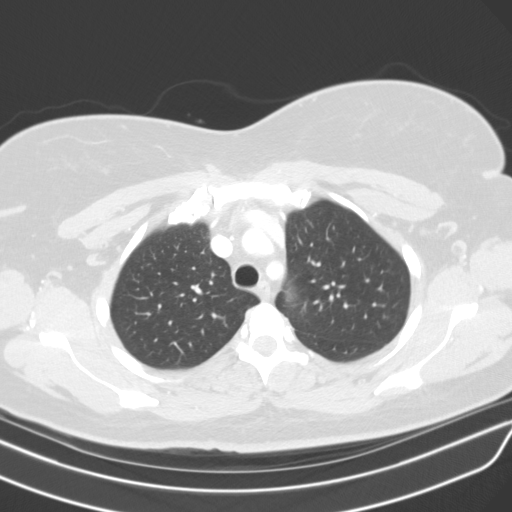
[im 133/156  lung]
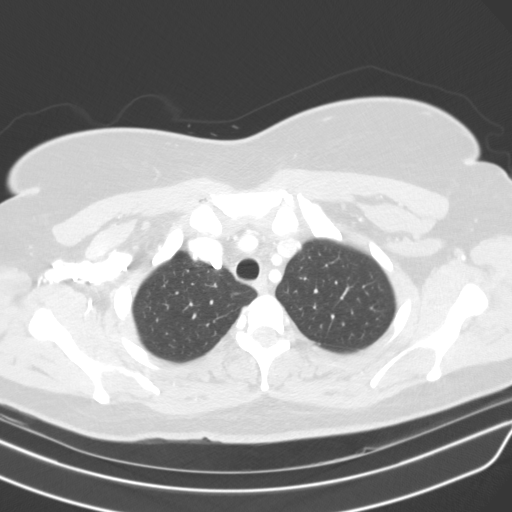
[im 144/156  lung]
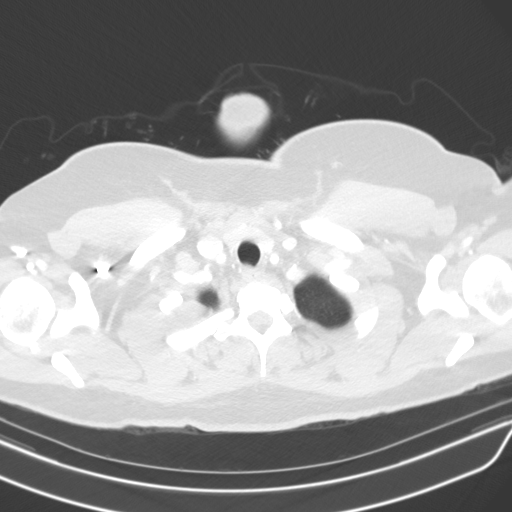

[15 of 34 positions shown; findings below may reference images not displayed]

FINDINGS: Cardiovascular: No significant vascular findings. Normal heart size.
No pericardial effusion.

Mediastinum/Nodes: The visualized thyroid is unremarkable. No
pathologic thoracic adenopathy. Probable residual thymic tissue
within the anterior mediastinum. The esophagus is unremarkable.

Lungs/Pleura: Lungs are clear. No pleural effusion or pneumothorax.
The central airways are widely patent.

Upper Abdomen: Mild hepatic steatosis. 3.5 cm left adrenal
myelolipoma. No acute abnormality.

Musculoskeletal: The osseous structures are unremarkable. No acute
bone abnormality. No lytic or blastic bone lesion. No abnormal
subcutaneous soft tissue mass identified within the thorax.
IMPRESSION: No radiographic evidence of acute cardiopulmonary disease. No
subcutaneous soft tissue mass identified within the thorax.

Mild hepatic steatosis.

3.5 cm left adrenal myelolipoma.

## 2022-07-08 ENCOUNTER — Encounter: Payer: Self-pay | Admitting: Infectious Disease

## 2022-07-08 ENCOUNTER — Other Ambulatory Visit: Payer: Self-pay | Admitting: Family Medicine

## 2022-07-08 DIAGNOSIS — M7989 Other specified soft tissue disorders: Secondary | ICD-10-CM

## 2022-07-12 ENCOUNTER — Other Ambulatory Visit (HOSPITAL_COMMUNITY): Payer: Self-pay | Admitting: Family Medicine

## 2022-07-12 DIAGNOSIS — R002 Palpitations: Secondary | ICD-10-CM

## 2022-07-15 ENCOUNTER — Ambulatory Visit
Admission: RE | Admit: 2022-07-15 | Discharge: 2022-07-15 | Disposition: A | Discharge status: Home / Self Care | Payer: BC Managed Care – PPO | Source: Ambulatory Visit | Attending: Family Medicine | Admitting: Family Medicine

## 2022-07-15 DIAGNOSIS — M7989 Other specified soft tissue disorders: Secondary | ICD-10-CM

## 2022-07-25 ENCOUNTER — Telehealth: Payer: Self-pay

## 2022-07-25 NOTE — Telephone Encounter (Signed)
Attempted to call patient to discuss refill and schedule appointment with Doren Custard, NP for pelvic exam. Not able to reach her at this time. Left voicemail requesting call back to discuss refill. Leatrice Jewels, RMA

## 2022-07-25 NOTE — Telephone Encounter (Signed)
Received fax from [facility] requesting refills on Famciclovir '250mg'$  tablets. Patient is not due for follow up on October. Will forward message to MD to advise on refill. Leatrice Jewels, RMA

## 2022-07-26 ENCOUNTER — Other Ambulatory Visit: Payer: Self-pay

## 2022-07-26 MED ORDER — FAMCICLOVIR 250 MG PO TABS: 250.0000 mg | ORAL_TABLET | Freq: Two times a day (BID) | ORAL | 3 refills | Status: DC

## 2022-07-26 NOTE — Telephone Encounter (Signed)
Per Dr.[place] - okay to refill famciclovir 60 tablet x3 refill. Patient needs to schedule appointment for pelvic exam with Janene Madeira, NP when having outbreak. Patient stated that she doesn't get the outbreaks often but if she does she will call to schedule appointment and verbalized her understanding.    [place], CMA

## 2022-07-29 ENCOUNTER — Other Ambulatory Visit (HOSPITAL_COMMUNITY): Payer: BC Managed Care – PPO

## 2022-08-05 ENCOUNTER — Ambulatory Visit (HOSPITAL_COMMUNITY): Payer: BC Managed Care – PPO | Attending: Cardiovascular Disease

## 2022-08-05 DIAGNOSIS — R002 Palpitations: Secondary | ICD-10-CM | POA: Insufficient documentation

## 2022-08-05 LAB — ECHOCARDIOGRAM COMPLETE
Area-P 1/2: 3.85 cm2
S' Lateral: 2.8 cm

## 2022-08-11 ENCOUNTER — Other Ambulatory Visit: Payer: Self-pay | Admitting: Family Medicine

## 2022-08-11 DIAGNOSIS — R2230 Localized swelling, mass and lump, unspecified upper limb: Secondary | ICD-10-CM

## 2022-08-28 ENCOUNTER — Ambulatory Visit
Admission: RE | Admit: 2022-08-28 | Discharge: 2022-08-28 | Disposition: A | Discharge status: Home / Self Care | Payer: BC Managed Care – PPO | Source: Ambulatory Visit | Attending: Family Medicine | Admitting: Family Medicine

## 2022-08-28 DIAGNOSIS — R2230 Localized swelling, mass and lump, unspecified upper limb: Secondary | ICD-10-CM

## 2022-08-28 MED ORDER — GADOBENATE DIMEGLUMINE 529 MG/ML IV SOLN
20.0000 mL | Freq: Once | INTRAVENOUS | Status: AC | PRN
  Administered 2022-08-28: 20 mL via INTRAVENOUS

## 2022-09-15 ENCOUNTER — Other Ambulatory Visit: Payer: Self-pay

## 2022-09-15 ENCOUNTER — Encounter (HOSPITAL_COMMUNITY): Payer: Self-pay

## 2022-09-15 ENCOUNTER — Emergency Department (HOSPITAL_COMMUNITY)
Admission: EM | Admit: 2022-09-15 | Discharge: 2022-09-15 | Disposition: A | Discharge status: Home / Self Care | Payer: BC Managed Care – PPO | Attending: Emergency Medicine | Admitting: Emergency Medicine

## 2022-09-15 ENCOUNTER — Emergency Department (HOSPITAL_COMMUNITY): Payer: BC Managed Care – PPO

## 2022-09-15 DIAGNOSIS — Y92410 Unspecified street and highway as the place of occurrence of the external cause: Secondary | ICD-10-CM | POA: Insufficient documentation

## 2022-09-15 DIAGNOSIS — M546 Pain in thoracic spine: Secondary | ICD-10-CM | POA: Diagnosis not present

## 2022-09-15 DIAGNOSIS — R0789 Other chest pain: Secondary | ICD-10-CM | POA: Diagnosis not present

## 2022-09-15 DIAGNOSIS — G8911 Acute pain due to trauma: Secondary | ICD-10-CM | POA: Diagnosis not present

## 2022-09-15 DIAGNOSIS — M542 Cervicalgia: Secondary | ICD-10-CM | POA: Insufficient documentation

## 2022-09-15 MED ORDER — METHOCARBAMOL 500 MG PO TABS: 500.0000 mg | ORAL_TABLET | Freq: Two times a day (BID) | ORAL | 0 refills | Status: AC

## 2022-09-15 MED ORDER — LIDOCAINE 5 % EX PTCH
2.0000 | MEDICATED_PATCH | CUTANEOUS | Status: DC
  Administered 2022-09-15: 2 via TRANSDERMAL
  Filled 2022-09-15: qty 2

## 2022-09-15 MED ORDER — KETOROLAC TROMETHAMINE 15 MG/ML IJ SOLN
15.0000 mg | Freq: Once | INTRAMUSCULAR | Status: AC
  Administered 2022-09-15: 15 mg via INTRAMUSCULAR
  Filled 2022-09-15: qty 1
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
## 2022-12-21 ENCOUNTER — Telehealth: Payer: Self-pay

## 2022-12-21 NOTE — Telephone Encounter (Signed)
Received refill request for Famciclovir 250 tablets. Will forward message to provider to advise on refill. Patient does not have appointment currently scheduled. Will send patient mychart message to see if she is having outbreak. Leatrice Jewels, RMA

## 2022-12-27 ENCOUNTER — Other Ambulatory Visit: Payer: Self-pay

## 2022-12-27 MED ORDER — FAMCICLOVIR 250 MG PO TABS: 250.0000 mg | ORAL_TABLET | Freq: Two times a day (BID) | ORAL | 0 refills | Status: AC

## 2024-09-13 ENCOUNTER — Other Ambulatory Visit: Payer: Self-pay | Admitting: Obstetrics and Gynecology

## 2024-09-13 DIAGNOSIS — Z1231 Encounter for screening mammogram for malignant neoplasm of breast: Secondary | ICD-10-CM

## 2024-09-18 ENCOUNTER — Ambulatory Visit
Admission: RE | Admit: 2024-09-18 | Discharge: 2024-09-18 | Disposition: A | Discharge status: Home / Self Care | Source: Ambulatory Visit | Attending: Obstetrics and Gynecology | Admitting: Obstetrics and Gynecology

## 2024-09-18 DIAGNOSIS — Z1231 Encounter for screening mammogram for malignant neoplasm of breast: Secondary | ICD-10-CM

## 2024-11-12 ENCOUNTER — Inpatient Hospital Stay: Admission: RE | Admit: 2024-11-12 | Source: Ambulatory Visit

## 2024-12-04 ENCOUNTER — Other Ambulatory Visit: Payer: Self-pay | Admitting: Obstetrics and Gynecology

## 2024-12-04 DIAGNOSIS — N644 Mastodynia: Secondary | ICD-10-CM

## 2024-12-10 ENCOUNTER — Ambulatory Visit

## 2024-12-11 ENCOUNTER — Inpatient Hospital Stay
Admission: RE | Admit: 2024-12-11 | Discharge: 2024-12-11 | Attending: Obstetrics and Gynecology | Admitting: Obstetrics and Gynecology

## 2024-12-11 DIAGNOSIS — N644 Mastodynia: Secondary | ICD-10-CM
